# Patient Record
Sex: Male | Born: 1940 | Race: White | Hispanic: No | Marital: Single | Smoking: Former smoker
Health system: Southern US, Community
[De-identification: ages and names within clinical notes are randomized; demographics above are authoritative.]

## PROBLEM LIST (undated history)

## (undated) DIAGNOSIS — J449 Chronic obstructive pulmonary disease, unspecified: Secondary | ICD-10-CM

## (undated) DIAGNOSIS — Z972 Presence of dental prosthetic device (complete) (partial): Secondary | ICD-10-CM

## (undated) DIAGNOSIS — J45909 Unspecified asthma, uncomplicated: Secondary | ICD-10-CM

## (undated) DIAGNOSIS — R0982 Postnasal drip: Secondary | ICD-10-CM

## (undated) DIAGNOSIS — K219 Gastro-esophageal reflux disease without esophagitis: Secondary | ICD-10-CM

## (undated) DIAGNOSIS — R2230 Localized swelling, mass and lump, unspecified upper limb: Secondary | ICD-10-CM

## (undated) DIAGNOSIS — D649 Anemia, unspecified: Secondary | ICD-10-CM

## (undated) DIAGNOSIS — M1990 Unspecified osteoarthritis, unspecified site: Secondary | ICD-10-CM

## (undated) DIAGNOSIS — Z9981 Dependence on supplemental oxygen: Secondary | ICD-10-CM

## (undated) HISTORY — DX: Anemia, unspecified: D64.9

## (undated) HISTORY — PX: TONSILLECTOMY: SUR1361

## (undated) HISTORY — PX: PILONIDAL CYST EXCISION: SHX744

## (undated) HISTORY — PX: GANGLION CYST EXCISION: SHX1691

---

## 2006-06-04 HISTORY — PX: SHOULDER ARTHROSCOPY: SHX128

## 2008-04-15 ENCOUNTER — Encounter: Admission: RE | Admit: 2008-04-15 | Discharge: 2008-04-15 | Payer: Self-pay | Admitting: Orthopedic Surgery

## 2008-05-17 ENCOUNTER — Encounter: Admission: RE | Admit: 2008-05-17 | Discharge: 2008-06-02 | Payer: Self-pay | Admitting: Orthopedic Surgery

## 2008-06-08 ENCOUNTER — Encounter: Admission: RE | Admit: 2008-06-08 | Discharge: 2008-09-06 | Payer: Self-pay | Admitting: Orthopedic Surgery

## 2010-06-25 ENCOUNTER — Encounter: Payer: Self-pay | Admitting: Orthopedic Surgery

## 2012-08-12 ENCOUNTER — Other Ambulatory Visit: Payer: Self-pay | Admitting: Orthopedic Surgery

## 2012-08-12 DIAGNOSIS — M545 Low back pain, unspecified: Secondary | ICD-10-CM

## 2012-08-12 DIAGNOSIS — M7918 Myalgia, other site: Secondary | ICD-10-CM

## 2012-08-12 DIAGNOSIS — M25552 Pain in left hip: Secondary | ICD-10-CM

## 2012-08-18 ENCOUNTER — Other Ambulatory Visit: Payer: Self-pay

## 2012-08-22 ENCOUNTER — Ambulatory Visit
Admission: RE | Admit: 2012-08-22 | Discharge: 2012-08-22 | Disposition: A | Discharge status: Home / Self Care | Payer: Medicare Other | Source: Ambulatory Visit | Attending: Orthopedic Surgery | Admitting: Orthopedic Surgery

## 2012-08-22 DIAGNOSIS — M25552 Pain in left hip: Secondary | ICD-10-CM

## 2012-08-22 DIAGNOSIS — M7918 Myalgia, other site: Secondary | ICD-10-CM

## 2012-08-22 DIAGNOSIS — M545 Low back pain: Secondary | ICD-10-CM

## 2012-09-23 ENCOUNTER — Other Ambulatory Visit: Payer: Self-pay | Admitting: Neurosurgery

## 2012-09-29 ENCOUNTER — Encounter (HOSPITAL_COMMUNITY): Payer: Self-pay

## 2012-09-29 ENCOUNTER — Encounter (HOSPITAL_COMMUNITY)
Admission: RE | Admit: 2012-09-29 | Discharge: 2012-09-29 | Disposition: A | Discharge status: Home / Self Care | Payer: Medicare Other | Source: Ambulatory Visit | Attending: Neurosurgery | Admitting: Neurosurgery

## 2012-09-29 HISTORY — DX: Unspecified asthma, uncomplicated: J45.909

## 2012-09-29 HISTORY — DX: Postnasal drip: R09.82

## 2012-09-29 HISTORY — DX: Unspecified osteoarthritis, unspecified site: M19.90

## 2012-09-29 HISTORY — DX: Gastro-esophageal reflux disease without esophagitis: K21.9

## 2012-09-29 LAB — BASIC METABOLIC PANEL
BUN: 16 mg/dL (ref 6–23)
CO2: 29 mEq/L (ref 19–32)
GFR calc non Af Amer: 60 mL/min — ABNORMAL LOW (ref >90)
Glucose, Bld: 94 mg/dL (ref 70–99)
Potassium: 4.4 mEq/L (ref 3.5–5.1)
Sodium: 140 mEq/L (ref 135–145)

## 2012-09-29 LAB — CBC
Hemoglobin: 13.6 g/dL (ref 13.0–17.0)
MCH: 31.4 pg (ref 26.0–34.0)
MCHC: 34.1 g/dL (ref 30.0–36.0)
MCV: 92.1 fL (ref 78.0–100.0)
RBC: 4.33 MIL/uL (ref 4.22–5.81)

## 2012-09-29 NOTE — Progress Notes (Signed)
Pt. Reports that he hasn't seen PCP MD in 30 yrs.

## 2012-09-29 NOTE — Pre-Procedure Instructions (Addendum)
Tyler Horne  09/29/2012   Your procedure is scheduled on:  10/02/2012  Report to [facility] at 10:00 AM.  Call this number if you have problems the morning of surgery: [phone]   Remember:  Discontinue aspirin, anti-inflammatory meds.    Do not eat food or drink liquids after midnight.  WEDNESDAY   Take these medicines the morning of surgery with A SIP OF WATER: NONE   Do not wear jewelry  Do not wear lotions, powders, or perfumes. You may wear deodorant.             Men may shave face and neck.  Do not bring valuables to the hospital.  Contacts, dentures or bridgework may not be worn into surgery.  Leave suitcase in the car. After surgery it may be brought to your room.  For patients admitted to the hospital, checkout time is 11:00 AM the day of  discharge.   Patients discharged the day of surgery will not be allowed to drive  home.  Name and phone number of your driver: /w family  Special Instructions: Shower using CHG 2 nights before surgery and the night before surgery.  If you shower the day of surgery use CHG.  Use special wash - you have one bottle of CHG for all showers.  You should use approximately 1/3 of the bottle for each shower.   Please read over the following fact sheets that you were given: Pain Booklet, Coughing and Deep Breathing, MRSA Information and Surgical Site Infection Prevention

## 2012-10-01 MED ORDER — CEFAZOLIN SODIUM-DEXTROSE 2-3 GM-% IV SOLR
2.0000 g | INTRAVENOUS | Status: AC
  Administered 2012-10-02: 2 g via INTRAVENOUS
  Filled 2012-10-01: qty 50

## 2012-10-02 ENCOUNTER — Inpatient Hospital Stay (HOSPITAL_COMMUNITY)
Admission: RE | Admit: 2012-10-02 | Discharge: 2012-10-03 | DRG: 491 | Disposition: A | Discharge status: Home / Self Care | Payer: Medicare Other | Source: Ambulatory Visit | Attending: Neurosurgery | Admitting: Neurosurgery

## 2012-10-02 ENCOUNTER — Encounter (HOSPITAL_COMMUNITY)
Admission: RE | Disposition: A | Discharge status: Home / Self Care | Payer: Self-pay | Source: Ambulatory Visit | Attending: Neurosurgery

## 2012-10-02 ENCOUNTER — Encounter (HOSPITAL_COMMUNITY): Payer: Self-pay | Admitting: Certified Registered"

## 2012-10-02 ENCOUNTER — Encounter (HOSPITAL_COMMUNITY): Payer: Self-pay | Admitting: *Deleted

## 2012-10-02 ENCOUNTER — Inpatient Hospital Stay (HOSPITAL_COMMUNITY): Payer: Medicare Other

## 2012-10-02 ENCOUNTER — Inpatient Hospital Stay (HOSPITAL_COMMUNITY): Payer: Medicare Other | Admitting: Certified Registered"

## 2012-10-02 DIAGNOSIS — M51379 Other intervertebral disc degeneration, lumbosacral region without mention of lumbar back pain or lower extremity pain: Secondary | ICD-10-CM | POA: Diagnosis present

## 2012-10-02 DIAGNOSIS — J45909 Unspecified asthma, uncomplicated: Secondary | ICD-10-CM | POA: Diagnosis present

## 2012-10-02 DIAGNOSIS — Z7982 Long term (current) use of aspirin: Secondary | ICD-10-CM

## 2012-10-02 DIAGNOSIS — M5126 Other intervertebral disc displacement, lumbar region: Secondary | ICD-10-CM

## 2012-10-02 DIAGNOSIS — K219 Gastro-esophageal reflux disease without esophagitis: Secondary | ICD-10-CM | POA: Diagnosis present

## 2012-10-02 DIAGNOSIS — M5137 Other intervertebral disc degeneration, lumbosacral region: Secondary | ICD-10-CM | POA: Diagnosis present

## 2012-10-02 DIAGNOSIS — Z01812 Encounter for preprocedural laboratory examination: Secondary | ICD-10-CM

## 2012-10-02 DIAGNOSIS — Z79899 Other long term (current) drug therapy: Secondary | ICD-10-CM

## 2012-10-02 DIAGNOSIS — F17200 Nicotine dependence, unspecified, uncomplicated: Secondary | ICD-10-CM | POA: Diagnosis present

## 2012-10-02 HISTORY — PX: LUMBAR LAMINECTOMY/DECOMPRESSION MICRODISCECTOMY: SHX5026

## 2012-10-02 SURGERY — LUMBAR LAMINECTOMY/DECOMPRESSION MICRODISCECTOMY 2 LEVELS
Anesthesia: General | Laterality: Left | Wound class: Clean

## 2012-10-02 MED ORDER — ACETAMINOPHEN 325 MG PO TABS: 650.0000 mg | ORAL_TABLET | ORAL | Status: DC | PRN

## 2012-10-02 MED ORDER — ARTIFICIAL TEARS OP OINT
TOPICAL_OINTMENT | OPHTHALMIC | Status: DC | PRN
  Administered 2012-10-02: 1 via OPHTHALMIC

## 2012-10-02 MED ORDER — MEPERIDINE HCL 25 MG/ML IJ SOLN: 6.2500 mg | INTRAMUSCULAR | Status: DC | PRN

## 2012-10-02 MED ORDER — OXYCODONE HCL 5 MG/5ML PO SOLN: 5.0000 mg | Freq: Once | ORAL | Status: DC | PRN

## 2012-10-02 MED ORDER — BUPIVACAINE-EPINEPHRINE PF 0.5-1:200000 % IJ SOLN
INTRAMUSCULAR | Status: DC | PRN
  Administered 2012-10-02: 10 mL
  Administered 2012-10-02: 9 mL

## 2012-10-02 MED ORDER — PHENYLEPHRINE HCL 10 MG/ML IJ SOLN
10.0000 mg | INTRAVENOUS | Status: DC | PRN
  Administered 2012-10-02: 20 ug/min via INTRAVENOUS

## 2012-10-02 MED ORDER — SODIUM CHLORIDE 0.9 % IR SOLN
Status: DC | PRN
  Administered 2012-10-02: 13:00:00

## 2012-10-02 MED ORDER — OXYCODONE-ACETAMINOPHEN 5-325 MG PO TABS: 1.0000 | ORAL_TABLET | ORAL | Status: DC | PRN

## 2012-10-02 MED ORDER — PROPOFOL 10 MG/ML IV BOLUS
INTRAVENOUS | Status: DC | PRN
  Administered 2012-10-02: 100 mg via INTRAVENOUS

## 2012-10-02 MED ORDER — THROMBIN 20000 UNITS EX SOLR
CUTANEOUS | Status: DC | PRN
  Administered 2012-10-02: 13:00:00 via TOPICAL

## 2012-10-02 MED ORDER — PHENYLEPHRINE HCL 10 MG/ML IJ SOLN
INTRAMUSCULAR | Status: DC | PRN
  Administered 2012-10-02: 80 ug via INTRAVENOUS
  Administered 2012-10-02: 120 ug via INTRAVENOUS
  Administered 2012-10-02 (×4): 80 ug via INTRAVENOUS
  Administered 2012-10-02: 120 ug via INTRAVENOUS
  Administered 2012-10-02 (×2): 80 ug via INTRAVENOUS

## 2012-10-02 MED ORDER — GLYCOPYRROLATE 0.2 MG/ML IJ SOLN
INTRAMUSCULAR | Status: DC | PRN
  Administered 2012-10-02: 0.6 mg via INTRAVENOUS
  Administered 2012-10-02: 0.2 mg via INTRAVENOUS

## 2012-10-02 MED ORDER — OXYCODONE HCL 5 MG PO TABS: 5.0000 mg | ORAL_TABLET | Freq: Once | ORAL | Status: DC | PRN

## 2012-10-02 MED ORDER — LACTATED RINGERS IV SOLN
INTRAVENOUS | Status: DC
  Administered 2012-10-02: 18:00:00 via INTRAVENOUS

## 2012-10-02 MED ORDER — ROCURONIUM BROMIDE 100 MG/10ML IV SOLN
INTRAVENOUS | Status: DC | PRN
  Administered 2012-10-02: 50 mg via INTRAVENOUS

## 2012-10-02 MED ORDER — ACETAMINOPHEN 10 MG/ML IV SOLN
INTRAVENOUS | Status: AC
  Administered 2012-10-02: 1000 mg via INTRAVENOUS
  Filled 2012-10-02: qty 100

## 2012-10-02 MED ORDER — ALUM & MAG HYDROXIDE-SIMETH 200-200-20 MG/5ML PO SUSP: 30.0000 mL | Freq: Four times a day (QID) | ORAL | Status: DC | PRN

## 2012-10-02 MED ORDER — 0.9 % SODIUM CHLORIDE (POUR BTL) OPTIME
TOPICAL | Status: DC | PRN
  Administered 2012-10-02: 1000 mL

## 2012-10-02 MED ORDER — PHENOL 1.4 % MT LIQD: 1.0000 | OROMUCOSAL | Status: DC | PRN

## 2012-10-02 MED ORDER — ACETAMINOPHEN 650 MG RE SUPP: 650.0000 mg | RECTAL | Status: DC | PRN

## 2012-10-02 MED ORDER — CEFAZOLIN SODIUM-DEXTROSE 2-3 GM-% IV SOLR
2.0000 g | Freq: Three times a day (TID) | INTRAVENOUS | Status: AC
  Administered 2012-10-02 – 2012-10-03 (×2): 2 g via INTRAVENOUS
  Filled 2012-10-02 (×2): qty 50

## 2012-10-02 MED ORDER — LIDOCAINE HCL 4 % MT SOLN
OROMUCOSAL | Status: DC | PRN
  Administered 2012-10-02: 4 mL via TOPICAL

## 2012-10-02 MED ORDER — FENTANYL CITRATE 0.05 MG/ML IJ SOLN
INTRAMUSCULAR | Status: DC | PRN
  Administered 2012-10-02: 100 ug via INTRAVENOUS

## 2012-10-02 MED ORDER — HYDROCODONE-ACETAMINOPHEN 5-325 MG PO TABS: 1.0000 | ORAL_TABLET | ORAL | Status: DC | PRN

## 2012-10-02 MED ORDER — DIAZEPAM 5 MG PO TABS
5.0000 mg | ORAL_TABLET | Freq: Four times a day (QID) | ORAL | Status: DC | PRN
  Administered 2012-10-03: 5 mg via ORAL
  Filled 2012-10-02: qty 1

## 2012-10-02 MED ORDER — HYDROMORPHONE HCL PF 1 MG/ML IJ SOLN: 0.2500 mg | INTRAMUSCULAR | Status: DC | PRN

## 2012-10-02 MED ORDER — EPHEDRINE SULFATE 50 MG/ML IJ SOLN
INTRAMUSCULAR | Status: DC | PRN
  Administered 2012-10-02: 10 mg via INTRAVENOUS

## 2012-10-02 MED ORDER — PROMETHAZINE HCL 25 MG/ML IJ SOLN: 6.2500 mg | INTRAMUSCULAR | Status: DC | PRN

## 2012-10-02 MED ORDER — ONDANSETRON HCL 4 MG/2ML IJ SOLN: 4.0000 mg | INTRAMUSCULAR | Status: DC | PRN

## 2012-10-02 MED ORDER — MENTHOL 3 MG MT LOZG: 1.0000 | LOZENGE | OROMUCOSAL | Status: DC | PRN

## 2012-10-02 MED ORDER — LACTATED RINGERS IV SOLN
INTRAVENOUS | Status: DC | PRN
  Administered 2012-10-02 (×2): via INTRAVENOUS

## 2012-10-02 MED ORDER — ONDANSETRON HCL 4 MG/2ML IJ SOLN
INTRAMUSCULAR | Status: DC | PRN
  Administered 2012-10-02: 4 mg via INTRAVENOUS

## 2012-10-02 MED ORDER — DEXAMETHASONE SODIUM PHOSPHATE 4 MG/ML IJ SOLN
INTRAMUSCULAR | Status: DC | PRN
  Administered 2012-10-02: 4 mg via INTRAVENOUS

## 2012-10-02 MED ORDER — BACITRACIN 50000 UNITS IM SOLR
INTRAMUSCULAR | Status: AC
  Filled 2012-10-02: qty 1

## 2012-10-02 MED ORDER — DOCUSATE SODIUM 100 MG PO CAPS
100.0000 mg | ORAL_CAPSULE | Freq: Two times a day (BID) | ORAL | Status: DC
  Administered 2012-10-02 – 2012-10-03 (×2): 100 mg via ORAL
  Filled 2012-10-02 (×2): qty 1

## 2012-10-02 MED ORDER — BACITRACIN ZINC 500 UNIT/GM EX OINT
TOPICAL_OINTMENT | CUTANEOUS | Status: DC | PRN
  Administered 2012-10-02: 1 via TOPICAL

## 2012-10-02 MED ORDER — DIPHENHYDRAMINE HCL 25 MG PO CAPS: 25.0000 mg | ORAL_CAPSULE | Freq: Four times a day (QID) | ORAL | Status: DC | PRN

## 2012-10-02 MED ORDER — LIDOCAINE HCL (CARDIAC) 20 MG/ML IV SOLN
INTRAVENOUS | Status: DC | PRN
  Administered 2012-10-02: 40 mg via INTRAVENOUS

## 2012-10-02 MED ORDER — NEOSTIGMINE METHYLSULFATE 1 MG/ML IJ SOLN
INTRAMUSCULAR | Status: DC | PRN
  Administered 2012-10-02: 4 mg via INTRAVENOUS

## 2012-10-02 MED ORDER — MIDAZOLAM HCL 5 MG/5ML IJ SOLN
INTRAMUSCULAR | Status: DC | PRN
  Administered 2012-10-02: 2 mg via INTRAVENOUS

## 2012-10-02 MED ORDER — SODIUM CHLORIDE 0.9 % IV SOLN
INTRAVENOUS | Status: AC
  Filled 2012-10-02: qty 500

## 2012-10-02 MED ORDER — MIDAZOLAM HCL 2 MG/2ML IJ SOLN: 0.5000 mg | Freq: Once | INTRAMUSCULAR | Status: DC | PRN

## 2012-10-02 MED ORDER — MORPHINE SULFATE 2 MG/ML IJ SOLN: 1.0000 mg | INTRAMUSCULAR | Status: DC | PRN

## 2012-10-02 SURGICAL SUPPLY — 51 items
BAG DECANTER FOR FLEXI CONT (MISCELLANEOUS) ×2 IMPLANT
BENZOIN TINCTURE PRP APPL 2/3 (GAUZE/BANDAGES/DRESSINGS) ×2 IMPLANT
BLADE SURG ROTATE 9660 (MISCELLANEOUS) ×2 IMPLANT
BRUSH SCRUB EZ PLAIN DRY (MISCELLANEOUS) ×2 IMPLANT
BUR ACORN 6.0 (BURR) ×2 IMPLANT
BUR MATCHSTICK NEURO 3.0 LAGG (BURR) ×2 IMPLANT
CANISTER SUCTION 2500CC (MISCELLANEOUS) ×2 IMPLANT
CLOTH BEACON ORANGE TIMEOUT ST (SAFETY) ×2 IMPLANT
CONT SPEC 4OZ CLIKSEAL STRL BL (MISCELLANEOUS) ×2 IMPLANT
DRAPE LAPAROTOMY 100X72X124 (DRAPES) ×2 IMPLANT
DRAPE MICROSCOPE LEICA (MISCELLANEOUS) ×2 IMPLANT
DRAPE POUCH INSTRU U-SHP 10X18 (DRAPES) ×2 IMPLANT
DRAPE SURG 17X23 STRL (DRAPES) ×8 IMPLANT
ELECT BLADE 4.0 EZ CLEAN MEGAD (MISCELLANEOUS) ×2
ELECT REM PT RETURN 9FT ADLT (ELECTROSURGICAL) ×2
ELECTRODE BLDE 4.0 EZ CLN MEGD (MISCELLANEOUS) ×1 IMPLANT
ELECTRODE REM PT RTRN 9FT ADLT (ELECTROSURGICAL) ×1 IMPLANT
GAUZE SPONGE 4X4 16PLY XRAY LF (GAUZE/BANDAGES/DRESSINGS) IMPLANT
GLOVE BIO SURGEON STRL SZ8.5 (GLOVE) ×2 IMPLANT
GLOVE EXAM NITRILE LRG STRL (GLOVE) IMPLANT
GLOVE EXAM NITRILE MD LF STRL (GLOVE) IMPLANT
GLOVE EXAM NITRILE XL STR (GLOVE) IMPLANT
GLOVE EXAM NITRILE XS STR PU (GLOVE) IMPLANT
GLOVE INDICATOR 7.0 STRL GRN (GLOVE) ×4 IMPLANT
GLOVE SS BIOGEL STRL SZ 6.5 (GLOVE) ×2 IMPLANT
GLOVE SS BIOGEL STRL SZ 8 (GLOVE) ×1 IMPLANT
GLOVE SUPERSENSE BIOGEL SZ 6.5 (GLOVE) ×2
GLOVE SUPERSENSE BIOGEL SZ 8 (GLOVE) ×1
GOWN BRE IMP SLV AUR LG STRL (GOWN DISPOSABLE) ×2 IMPLANT
GOWN BRE IMP SLV AUR XL STRL (GOWN DISPOSABLE) ×2 IMPLANT
GOWN STRL REIN 2XL LVL4 (GOWN DISPOSABLE) IMPLANT
KIT BASIN OR (CUSTOM PROCEDURE TRAY) ×2 IMPLANT
KIT ROOM TURNOVER OR (KITS) ×2 IMPLANT
NEEDLE HYPO 21X1.5 SAFETY (NEEDLE) IMPLANT
NEEDLE HYPO 22GX1.5 SAFETY (NEEDLE) ×2 IMPLANT
NS IRRIG 1000ML POUR BTL (IV SOLUTION) ×2 IMPLANT
PACK LAMINECTOMY NEURO (CUSTOM PROCEDURE TRAY) ×2 IMPLANT
PAD ARMBOARD 7.5X6 YLW CONV (MISCELLANEOUS) ×6 IMPLANT
PATTIES SURGICAL .5 X1 (DISPOSABLE) IMPLANT
RUBBERBAND STERILE (MISCELLANEOUS) ×4 IMPLANT
SPONGE GAUZE 4X4 12PLY (GAUZE/BANDAGES/DRESSINGS) ×2 IMPLANT
SPONGE SURGIFOAM ABS GEL SZ50 (HEMOSTASIS) ×2 IMPLANT
STRIP CLOSURE SKIN 1/2X4 (GAUZE/BANDAGES/DRESSINGS) ×2 IMPLANT
SUT VIC AB 1 CT1 18XBRD ANBCTR (SUTURE) ×2 IMPLANT
SUT VIC AB 1 CT1 8-18 (SUTURE) ×2
SUT VIC AB 2-0 CP2 18 (SUTURE) ×4 IMPLANT
SYR 20CC LL (SYRINGE) IMPLANT
SYR 20ML ECCENTRIC (SYRINGE) ×2 IMPLANT
TOWEL OR 17X24 6PK STRL BLUE (TOWEL DISPOSABLE) ×2 IMPLANT
TOWEL OR 17X26 10 PK STRL BLUE (TOWEL DISPOSABLE) ×2 IMPLANT
WATER STERILE IRR 1000ML POUR (IV SOLUTION) ×2 IMPLANT

## 2012-10-02 NOTE — Progress Notes (Signed)
Subjective:  The patient is alert and pleasant. He looks well. He is in no apparent distress.  Objective: Vital signs in last 24 hours: Temp:  [97.9 F (36.6 C)-98 F (36.7 C)] 97.9 F (36.6 C) (05/01 1415) Pulse Rate:  [62-79] 62 (05/01 1445) Resp:  [15-18] 15 (05/01 1445) BP: (103-117)/(53-75) 103/69 mmHg (05/01 1445) SpO2:  [97 %-100 %] 100 % (05/01 1445)  Intake/Output from previous day:   Intake/Output this shift: Total I/O In: 1400 [I.V.:1400] Out: 50 [Blood:50]  Physical exam the patient is alert and pleasant. He is moving his lower extremities well.  Lab Results:  Recent Labs  09/29/12 1502  WBC 6.3  HGB 13.6  HCT 39.9  PLT 281   BMET  Recent Labs  09/29/12 1502  NA 140  K 4.4  CL 105  CO2 29  GLUCOSE 94  BUN 16  CREATININE 1.17  CALCIUM 9.5    Studies/Results: Dg Lumbar Spine 1 View  10/02/2012  *RADIOLOGY REPORT*  Clinical Data: Localization image for spinal surgery.  LUMBAR SPINE - 1 VIEW  Comparison: 08/22/2012.  Findings: The tip of a metallic instrument projects over the posterior elements at the level of L5-S1. 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  IMPRESSION: Tip of instrument projects over posterior elements at level of L5- S1.   Original Report Authenticated By: Onalee Hua Call     Assessment/Plan: The patient is doing well.  LOS: 0 days     Tyler Horne D 10/02/2012, 2:49 PM

## 2012-10-02 NOTE — Op Note (Signed)
Brief history: The patient is a 72 year old white male who complains of back and left leg pain consistent with a lumbar radiculopathy. He has failed medical management and was worked up with a lumbar MRI. This demonstrated a herniated disc at L4-5 and L5-S1 on the left compressing both the L5 and S1 nerve root. I discussed the various treatment options with the patient including a left L4-5 and 5 one discectomy. The patient has weighed the risks, benefits, and alternatives surgery and decided proceed with the operation.  Preoperative diagnosis: Left L4-5 and L5-S1 herniated disc, lumbago, lumbar discopathy, spinal stenosis  Postoperative diagnosis: The same  Procedure: Left L4-5 and L5-S1 Intervertebral discectomy using micro-dissection  Surgeon: Dr. Delma Officer  Asst.: Dr. Colon Branch  Anesthesia: Gen. endotracheal  Estimated blood loss: 50 cc  Drains: None  Complications: None  Description of procedure: The patient was brought to the operating room by the anesthesia team. General endotracheal anesthesia was induced. The patient was turned to the prone position on the Wilson frame. The patient's lumbosacral region was then prepared with Betadine scrub and Betadine solution. Sterile drapes were applied.  I then injected the area to be incised with Marcaine with epinephrine solution. I then used a scalpel to make a linear midline incision over the L4-5 and L5-S1 intervertebral disc space. I then used electrocautery to perform a left sided subperiosteal dissection exposing the spinous process and lamina of L4, L5 and the upper sacrum. We obtained intraoperative radiograph to confirm our location. I then inserted the [facility] retractor for exposure.  We then brought the operative microscope into the field. Under its magnification and illumination we completed the microdissection. I used a high-speed drill to perform a laminotomy at L4 and L5 on the left. I then used a Kerrison punches to  complete the left L5 hemilaminectomy and to widen the laminotomy and removed the ligamentum flavum at L4-5 and L5-S1 on the left.. We then used microdissection to free up the thecal sac and the L5 and S1 nerve root from the epidural tissue. I then used a Kerrison punch to perform a foraminotomy at about the left L5 and S1 nerve root. We then using the nerve root retractor to gently retract the thecal sac and the S1 nerve root medially. This exposed the intervertebral disc which compresses both the left L5 and S1 nerve roots. We identified the ruptured disc and remove it with the pituitary forceps. We inspected the intervertebral disc at L4-5 and L5-S1. There was a hole in the annulus at L5-S1 the left. There did not appear to be any impending herniations. I therefore did not enter into the intervertebral disc space.  I then palpated along the ventral surface of the thecal sac and along exit route of the left L5 and S1 nerve root and noted that the neural structures were well decompressed. This completed the decompression.  We then obtained hemostasis using bipolar electrocautery. We irrigated the wound out with bacitracin solution. We then removed the retractor. We then reapproximated the patient's thoracolumbar fascia with interrupted #1 Vicryl suture. We then reapproximated the patient's subcutaneous tissue with interrupted 3-0 Vicryl suture. We then reapproximated patient's skin with Steri-Strips and benzoin. The was then coated with bacitracin ointment. The drapes were removed. The patient was subsequently returned to the supine position where they were extubated by the anesthesia team. The patient was then transported to the postanesthesia care unit in stable condition. All sponge instrument and needle counts were reportedly correct at the end  of this case.

## 2012-10-02 NOTE — Transfer of Care (Signed)
Immediate Anesthesia Transfer of Care Note  Patient: Tyler Horne  Procedure(s) Performed: Procedure(s) with comments: LUMBAR LAMINECTOMY/DECOMPRESSION MICRODISCECTOMY 2 LEVELS (Left) - Left Lumbar four-five Lumbar five sacral one diskectomy  Patient Location: PACU  Anesthesia Type:General  Level of Consciousness: awake and patient cooperative  Airway & Oxygen Therapy: Patient Spontanous Breathing and Patient connected to nasal cannula oxygen  Post-op Assessment: Report given to PACU RN and Post -op Vital signs reviewed and stable  Post vital signs: Reviewed and stable  Complications: No apparent anesthesia complications

## 2012-10-02 NOTE — H&P (Signed)
Subjective: The patient is a 72 year old white male who complains of back left buttock and leg pain consistent with a lumbar radiculopathy. He has failed medical management and was worked up with a lumbar MRI. This demonstrated a herniated disc at L4-5 and L5-S1. I discussed the various treatment options with the patient including surgery. The patient has weighed the risks, benefits, and alternatives surgery and decided proceed with a left L4-5 and L5-S1 discectomy.   Past Medical History  Diagnosis Date  . Asthma     hayfever, seasonal allergies, + smoker, uses primatene pill on occas.   [name] Postnasal drip     uses Benadryl & primatene as needed   . GERD (gastroesophageal reflux disease)     uses Rolaids on rare occas.   . Arthritis     DDD, shoulder    Past Surgical History  Procedure Laterality Date  . Shoulder arthroscopy  2008    Left  . Ganglion cyst excision      L hand   . Tonsillectomy      No Known Allergies  History  Substance Use Topics  . Smoking status: Current Every Day Smoker -- 0.50 packs/day  . Smokeless tobacco: Not on file  . Alcohol Use: No    History reviewed. No pertinent family history. Prior to Admission medications   Medication Sig Start Date End Date Taking? Authorizing Provider  aspirin 325 MG tablet Take 325 mg by mouth daily as needed for pain.   Yes Historical Provider, MD  diphenhydrAMINE (BENADRYL) 25 mg capsule Take 25 mg by mouth every 6 (six) hours as needed for allergies.   Yes Historical Provider, MD  Ensure Plus (ENSURE PLUS) LIQD Take 237 mLs by mouth daily as needed (for vitamin consumption).   Yes Historical Provider, MD  Ephedrine-Guaifenesin (PRIMATENE ASTHMA) 12.5-200 MG TABS Take 0.25 tablets by mouth daily as needed (for allergies).   Yes Historical Provider, MD  ibuprofen (ADVIL,MOTRIN) 200 MG tablet Take 400 mg by mouth every 6 (six) hours as needed for pain.   Yes Historical Provider, MD  Multiple Vitamins-Minerals (CENTRUM SILVER  ADULT 50+) TABS Take 1 tablet by mouth daily.   Yes Historical Provider, MD  sodium chloride (OCEAN) 0.65 % nasal spray Place 1 spray into the nose as needed for congestion.   Yes Historical Provider, MD     Review of Systems  Positive ROS: As above  All other systems have been reviewed and were otherwise negative with the exception of those mentioned in the HPI and as above.  Objective: Vital signs in last 24 hours: Temp:  [98 F (36.7 C)] 98 F (36.7 C) (05/01 1014) Pulse Rate:  [79] 79 (05/01 1014) Resp:  [18] 18 (05/01 1014) BP: (117)/(75) 117/75 mmHg (05/01 1014) SpO2:  [97 %] 97 % (05/01 1014)  General Appearance: Alert, cooperative, no distress, appears stated age Head: Normocephalic, without obvious abnormality, atraumatic Eyes: PERRL, conjunctiva/corneas clear, EOM's intact, fundi benign, both eyes      Ears: Normal TM's and external ear canals, both ears Throat: Lips, mucosa, and tongue normal; teeth and gums normal Neck: Supple, symmetrical, trachea midline, no adenopathy; thyroid: No enlargement/tenderness/nodules; no carotid bruit or JVD Back: Symmetric, no curvature, ROM normal, no CVA tenderness Lungs: Clear to auscultation bilaterally, respirations unlabored Heart: Regular rate and rhythm, S1 and S2 normal, no murmur, rub or gallop Abdomen: Soft, non-tender, bowel sounds active all four quadrants, no masses, no organomegaly Extremities: Extremities normal, atraumatic, no cyanosis or edema Pulses: 2+  and symmetric all extremities Skin: Skin color, texture, turgor normal, no rashes or lesions  NEUROLOGIC:   Mental status: alert and oriented, no aphasia, good attention span, Fund of knowledge/ memory ok Motor Exam - grossly normal Sensory Exam - grossly normal Reflexes:  Coordination - grossly normal Gait - grossly normal Balance - grossly normal Cranial Nerves: I: smell Not tested  II: visual acuity  OS: Normal    OD: Normal   II: visual fields Full to  confrontation  II: pupils Equal, round, reactive to light  III,VII: ptosis None  III,IV,VI: extraocular muscles  Full ROM  V: mastication Normal  V: facial light touch sensation  Normal  V,VII: corneal reflex  Present  VII: facial muscle function - upper  Normal  VII: facial muscle function - lower Normal  VIII: hearing Not tested  IX: soft palate elevation  Normal  IX,X: gag reflex Present  XI: trapezius strength  5/5  XI: sternocleidomastoid strength 5/5  XI: neck flexion strength  5/5  XII: tongue strength  Normal    Data Review Lab Results  Component Value Date   WBC 6.3 09/29/2012   HGB 13.6 09/29/2012   HCT 39.9 09/29/2012   MCV 92.1 09/29/2012   PLT 281 09/29/2012   Lab Results  Component Value Date   NA 140 09/29/2012   K 4.4 09/29/2012   CL 105 09/29/2012   CO2 29 09/29/2012   BUN 16 09/29/2012   CREATININE 1.17 09/29/2012   GLUCOSE 94 09/29/2012   No results found for this basename: INR, PROTIME    Assessment/Plan: 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 I reviewed his MR scan with them and pointed out the abnormalities. We have discussed the various treatment options including surgery. I described the surgical option of left L4-5 and L5-S1 discectomy. I have described the surgery to him. I have shown him surgical models. We have discussed the risks, benefits, alternatives, and likelihood of achieving our goals with surgery. I've answered all the patient's questions. He wants to proceed with surgery.   Cristi Loron 10/02/2012 12:31 PM

## 2012-10-02 NOTE — Anesthesia Postprocedure Evaluation (Signed)
  Anesthesia Post-op Note  Patient: Tyler Horne  Procedure(s) Performed: Procedure(s) with comments: LUMBAR LAMINECTOMY/DECOMPRESSION MICRODISCECTOMY 2 LEVELS (Left) - Left Lumbar four-five Lumbar five sacral one diskectomy  Patient Location: PACU  Anesthesia Type:General  Level of Consciousness: awake, alert , oriented and patient cooperative  Airway and Oxygen Therapy: Patient Spontanous Breathing and Patient connected to nasal cannula oxygen  Post-op Pain: none  Post-op Assessment: Post-op Vital signs reviewed, Patient's Cardiovascular Status Stable, Respiratory Function Stable, Patent Airway, No signs of Nausea or vomiting and Pain level controlled  Post-op Vital Signs: Reviewed and stable  Complications: No apparent anesthesia complications

## 2012-10-02 NOTE — Preoperative (Signed)
Beta Blockers   Reason not to administer Beta Blockers:Not Applicable 

## 2012-10-02 NOTE — Anesthesia Procedure Notes (Signed)
Procedure Name: Intubation Date/Time: 10/02/2012 12:48 PM Performed by: Leona Singleton A Pre-anesthesia Checklist: Patient identified Patient Re-evaluated:Patient Re-evaluated prior to inductionOxygen Delivery Method: Circle system utilized Preoxygenation: Pre-oxygenation with 100% oxygen Intubation Type: IV induction Ventilation: Mask ventilation without difficulty Laryngoscope Size: Miller and 2 Grade View: Grade I Tube type: Oral Tube size: 7.0 mm Number of attempts: 1 Airway Equipment and Method: Stylet and LTA kit utilized Placement Confirmation: ETT inserted through vocal cords under direct vision,  positive ETCO2 and breath sounds checked- equal and bilateral Secured at: 22 cm Tube secured with: Tape Dental Injury: Teeth and Oropharynx as per pre-operative assessment

## 2012-10-02 NOTE — Anesthesia Preprocedure Evaluation (Addendum)
Anesthesia Evaluation  Patient identified by MRN, date of birth, ID band Patient awake    Reviewed: Allergy & Precautions, H&P , NPO status , Patient's Chart, lab work & pertinent test results  History of Anesthesia Complications Negative for: history of anesthetic complications  Airway Mallampati: II TM Distance: >3 FB Neck ROM: Full    Dental  (+) Edentulous Upper, Partial Lower, Dental Advisory Given, Missing and Poor Dentition   Pulmonary asthma , Current Smoker,  breath sounds clear to auscultation  Pulmonary exam normal       Cardiovascular negative cardio ROS  Rhythm:Regular Rate:Normal     Neuro/Psych Chronic back pain: ibuprofen  Neuromuscular disease (pain and numbness LLE>RLE)    GI/Hepatic Neg liver ROS, GERD-  Medicated,  Endo/Other  negative endocrine ROS  Renal/GU Renal disease (Cr 1.17)Creat 1.17  negative genitourinary   Musculoskeletal  (+) Arthritis -, Osteoarthritis,    Abdominal   Peds  Hematology negative hematology ROS (+)   Anesthesia Other Findings   Reproductive/Obstetrics                       Anesthesia Physical Anesthesia Plan  ASA: II  Anesthesia Plan: General   Post-op Pain Management:    Induction: Intravenous  Airway Management Planned: Oral ETT  Additional Equipment:   Intra-op Plan:   Post-operative Plan:   Informed Consent: I have reviewed the patients History and Physical, chart, labs and discussed the procedure including the risks, benefits and alternatives for the proposed anesthesia with the patient or authorized representative who has indicated his/her understanding and acceptance.   Dental advisory given  Plan Discussed with: Surgeon and CRNA  Anesthesia Plan Comments: (Plan routine monitors, GETA)        Anesthesia Quick Evaluation

## 2012-10-03 ENCOUNTER — Encounter (HOSPITAL_COMMUNITY): Payer: Self-pay | Admitting: *Deleted

## 2012-10-03 MED ORDER — DSS 100 MG PO CAPS: 100.0000 mg | ORAL_CAPSULE | Freq: Two times a day (BID) | ORAL | Status: AC

## 2012-10-03 MED ORDER — OXYCODONE-ACETAMINOPHEN 5-325 MG PO TABS: 1.0000 | ORAL_TABLET | ORAL | Status: DC | PRN

## 2012-10-03 MED ORDER — DIAZEPAM 5 MG PO TABS: 5.0000 mg | ORAL_TABLET | Freq: Four times a day (QID) | ORAL | Status: DC | PRN

## 2012-10-03 NOTE — Discharge Summary (Signed)
Physician Discharge Summary  Patient ID: Tyler Horne MRN: [id number] DOB/AGE: 1940-06-15 72 y.o.  Admit date: 10/02/2012 Discharge date: 10/03/2012  Admission Diagnoses: L4-5 and L5-S1 disc degeneration, herniated disc, stenosis, lumbar radiculopathy, lumbago  Discharge Diagnoses: The same Active Problems:   * No active hospital problems. *   Discharged Condition: good  Hospital Course: I admitted the patient to [facility] 10/02/2012. On that day I performed a left L4-5 and L5-S1 discectomy. The surgery went well.  The patient's postoperative course was unremarkable. On postoperative day #1 the patient looked and felt well. He requested discharge to home. He was given oral and written discharge instructions. All his questions were answered.  Consults: None Significant Diagnostic Studies: None Treatments: Left L4-5 and L5-S1 discectomy using microdissection Discharge Exam: Blood pressure 88/55, pulse 76, temperature 97.7 F (36.5 C), temperature source Oral, resp. rate 20, SpO2 98.00%. The patient is alert and oriented. His strength is normal. His dressing is clean and dry. He looks well.  Disposition: Home  Discharge Orders   Future Orders Complete By Expires     Call MD for:  difficulty breathing, headache or visual disturbances  As directed     Call MD for:  extreme fatigue  As directed     Call MD for:  hives  As directed     Call MD for:  persistant dizziness or light-headedness  As directed     Call MD for:  persistant nausea and vomiting  As directed     Call MD for:  redness, tenderness, or signs of infection (pain, swelling, redness, odor or green/yellow discharge around incision site)  As directed     Call MD for:  severe uncontrolled pain  As directed     Call MD for:  temperature >100.4  As directed     Diet - low sodium heart healthy  As directed     Discharge instructions  As directed     Comments:      Call [phone] for a followup appointment. Take a  stool softener while you are using pain medications.    Driving Restrictions  As directed     Comments:      Do not drive for 2 weeks.    Increase activity slowly  As directed     Lifting restrictions  As directed     Comments:      Do not lift more than 5 pounds. No excessive bending or twisting.    May shower / Bathe  As directed     Comments:      He may shower after the pain she is removed 3 days after surgery. Leave the incision alone.    Remove dressing in 48 hours  As directed     Comments:      Your stitches are under the scan and will dissolve by themselves. The Steri-Strips will fall off after you take a few showers. Do not rub back or pick at the wound, Leave the wound alone.        Medication List    TAKE these medications       aspirin 325 MG tablet  Take 325 mg by mouth daily as needed for pain.     CENTRUM SILVER ADULT 50+ Tabs  Take 1 tablet by mouth daily.     diazepam 5 MG tablet  Commonly known as:  VALIUM  Take 1 tablet (5 mg total) by mouth every 6 (six) hours as needed.  diphenhydrAMINE 25 mg capsule  Commonly known as:  BENADRYL  Take 25 mg by mouth every 6 (six) hours as needed for allergies.     DSS 100 MG Caps  Take 100 mg by mouth 2 (two) times daily.     Ensure Plus Liqd  Take 237 mLs by mouth daily as needed (for vitamin consumption).     ibuprofen 200 MG tablet  Commonly known as:  ADVIL,MOTRIN  Take 400 mg by mouth every 6 (six) hours as needed for pain.     oxyCODONE-acetaminophen 5-325 MG per tablet  Commonly known as:  PERCOCET/ROXICET  Take 1-2 tablets by mouth every 4 (four) hours as needed.     PRIMATENE ASTHMA 12.5-200 MG Tabs  Generic drug:  Ephedrine-Guaifenesin  Take 0.25 tablets by mouth daily as needed (for allergies).     sodium chloride 0.65 % nasal spray  Commonly known as:  OCEAN  Place 1 spray into the nose as needed for congestion.         SignedCristi Loron 10/03/2012, 7:44 AM

## 2012-10-06 ENCOUNTER — Encounter (HOSPITAL_COMMUNITY): Payer: Self-pay | Admitting: Neurosurgery

## 2014-03-24 ENCOUNTER — Ambulatory Visit
Admission: RE | Admit: 2014-03-24 | Discharge: 2014-03-24 | Disposition: A | Discharge status: Home / Self Care | Payer: Medicare PPO | Source: Ambulatory Visit | Attending: Internal Medicine | Admitting: Internal Medicine

## 2014-03-24 ENCOUNTER — Other Ambulatory Visit: Payer: Self-pay | Admitting: Internal Medicine

## 2014-03-24 DIAGNOSIS — R634 Abnormal weight loss: Secondary | ICD-10-CM

## 2016-05-21 ENCOUNTER — Other Ambulatory Visit: Payer: Self-pay | Admitting: Internal Medicine

## 2016-05-21 DIAGNOSIS — R1084 Generalized abdominal pain: Secondary | ICD-10-CM

## 2016-05-29 ENCOUNTER — Ambulatory Visit
Admission: RE | Admit: 2016-05-29 | Discharge: 2016-05-29 | Disposition: A | Discharge status: Home / Self Care | Payer: Medicare PPO | Source: Ambulatory Visit | Attending: Internal Medicine | Admitting: Internal Medicine

## 2016-05-29 DIAGNOSIS — R1084 Generalized abdominal pain: Secondary | ICD-10-CM

## 2016-07-02 ENCOUNTER — Encounter: Payer: Self-pay | Admitting: Emergency Medicine

## 2016-07-02 ENCOUNTER — Ambulatory Visit (INDEPENDENT_AMBULATORY_CARE_PROVIDER_SITE_OTHER): Payer: Medicare PPO | Admitting: Emergency Medicine

## 2016-07-02 VITALS — BP 108/68 | HR 79 | Ht 70.0 in | Wt 127.4 lb

## 2016-07-02 DIAGNOSIS — F17210 Nicotine dependence, cigarettes, uncomplicated: Secondary | ICD-10-CM | POA: Diagnosis not present

## 2016-07-02 DIAGNOSIS — I10 Essential (primary) hypertension: Secondary | ICD-10-CM | POA: Diagnosis not present

## 2016-07-02 DIAGNOSIS — J309 Allergic rhinitis, unspecified: Secondary | ICD-10-CM | POA: Insufficient documentation

## 2016-07-02 DIAGNOSIS — J301 Allergic rhinitis due to pollen: Secondary | ICD-10-CM | POA: Diagnosis not present

## 2016-07-02 DIAGNOSIS — F17200 Nicotine dependence, unspecified, uncomplicated: Secondary | ICD-10-CM | POA: Insufficient documentation

## 2016-07-02 DIAGNOSIS — J449 Chronic obstructive pulmonary disease, unspecified: Secondary | ICD-10-CM | POA: Diagnosis not present

## 2016-07-02 MED ORDER — TIOTROPIUM BROMIDE-OLODATEROL 2.5-2.5 MCG/ACT IN AERS: 2.0000 | INHALATION_SPRAY | Freq: Every day | RESPIRATORY_TRACT | 0 refills | Status: DC
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
## 2016-07-02 NOTE — Progress Notes (Signed)
Subjective:    Patient ID: Tyler Horne, male    DOB: 06/01/1941, 76 y.o.   MRN: [id number]  HPI 76 year old gentleman with a history of tobacco abuse (25 pack years), GERD, OA, chronic rhinitis. He was dx with COPD in 07/2015 based on symptoms, no hx PFT. He is referred by Dr Mancel Bale for further eval of his COPD. He reports that he has significant exertional SOB, is limited to about 100 ft on a hill. He had always been quite active until the last couple years. If he paces himself, goes slowly then he can do more. He has a lot of congestion from sinuses, sometimes to chest. Has to clear mucous most mornings. Light tan mucous. He has used Primatene tabs. He has not been on any other BD's, was given something by Dr Mancel Bale but never started. He has used mucinex-DM. Never has had a flare to his knowledge.    Review of Systems  Constitutional: Negative.  Negative for fever and unexpected weight change.  HENT: Positive for postnasal drip and rhinorrhea. Negative for congestion, dental problem, ear pain, nosebleeds, sinus pressure, sneezing, sore throat and trouble swallowing.   Eyes: Negative.  Negative for redness and itching.  Respiratory: Positive for cough and shortness of breath. Negative for chest tightness and wheezing.   Cardiovascular: Negative.  Negative for palpitations and leg swelling.  Gastrointestinal: Negative.  Negative for nausea and vomiting.  Genitourinary: Negative.  Negative for dysuria.  Musculoskeletal: Negative.  Negative for joint swelling.  Skin: Negative.  Negative for rash.  Neurological: Negative.  Negative for headaches.  Hematological: Negative.  Does not bruise/bleed easily.  Psychiatric/Behavioral: Negative.  Negative for dysphoric mood. The patient is not nervous/anxious.     Past Medical History:  Diagnosis Date  . Arthritis    DDD, shoulder  . Asthma    hayfever, seasonal allergies, + smoker, uses primatene pill on occas.   [name] GERD (gastroesophageal reflux  disease)    uses Rolaids on rare occas.   [name] Postnasal drip    uses Benadryl & primatene as needed      No family history on file.   Social History   Social History  . Marital status: Single    Spouse name: N/A  . Number of children: N/A  . Years of education: N/A   Occupational History  . Not on file.   Social History Main Topics  . Smoking status: Current Every Day Smoker    Packs/day: 0.50  . Smokeless tobacco: Not on file  . Alcohol use No  . Drug use: No  . Sexual activity: Not on file   Other Topics Concern  . Not on file   Social History Narrative  . No narrative on file  He has worked in [employer], no known asbestos.  Brief military service in the reserves.  [place] native, has lived in [place],   No Known Allergies   Outpatient Medications Prior to Visit  Medication Sig Dispense Refill  . diphenhydrAMINE (BENADRYL) 25 mg capsule Take 25 mg by mouth every 6 (six) hours as needed for allergies.    [name] docusate sodium 100 MG CAPS Take 100 mg by mouth 2 (two) times daily. 60 capsule 1  . Ensure Plus (ENSURE PLUS) LIQD Take 237 mLs by mouth daily as needed (for vitamin consumption).    [name] Ephedrine-Guaifenesin (PRIMATENE ASTHMA) 12.5-200 MG TABS Take 0.25 tablets by mouth daily as needed (for allergies).    . Multiple Vitamins-Minerals (CENTRUM SILVER ADULT  50+) TABS Take 1 tablet by mouth daily.    . sodium chloride (OCEAN) 0.65 % nasal spray Place 1 spray into the nose as needed for congestion.    [name] aspirin 325 MG tablet Take 325 mg by mouth daily as needed for pain.    . diazepam (VALIUM) 5 MG tablet Take 1 tablet (5 mg total) by mouth every 6 (six) hours as needed. (Patient not taking: Reported on 07/02/2016) 50 tablet 1  . ibuprofen (ADVIL,MOTRIN) 200 MG tablet Take 400 mg by mouth every 6 (six) hours as needed for pain.    [name] oxyCODONE-acetaminophen (PERCOCET/ROXICET) 5-325 MG per tablet Take 1-2 tablets by mouth every 4 (four) hours as needed. (Patient not  taking: Reported on 07/02/2016) 100 tablet 0   No facility-administered medications prior to visit.         Objective:   Physical Exam Vitals:   07/02/16 1555  BP: 108/68  Pulse: 79  SpO2: 95%  Weight: 127 lb 6.4 oz (57.8 kg)  Height: 5\' 10"  (1.778 m)   Gen: Pleasant, thin with some temporal wasting, in no distress,  normal affect  ENT: No lesions,  mouth clear,  oropharynx clear, no postnasal drip  Neck: No JVD, no stridor  Lungs: No use of accessory muscles, distant and decreased throughout, clear without rales or rhonchi, no wheeze  Cardiovascular: RRR, heart sounds normal, no murmur or gallops, no peripheral edema  Musculoskeletal: No deformities, no cyanosis or clubbing  Neuro: alert, non focal  Skin: Warm, no lesions or rashes     Assessment & Plan:  Tobacco use disorder Tobacco use with her COPD. Discussed the importance of cessation today. Including the impact it will likely have on his COPD, functional capacity, risk for exacerbations. Discussed with him in general the strategies we can use to try and stop. He was not ready to talk about this in detail today but I would like to revisit with him at his next visit. Time spent was 5 minutes.  COPD (chronic obstructive pulmonary disease) (HCC) History and his tobacco use and suspected the almost certainly has COPD. He very well may be hypoxemic. We will perform walking oximetry today. He needs pulmonary function testing as soon as possible. I'd also like to go ahead and start him on empiric bronchodilators before his PFTs are done. We will start Stiolto, 2 puffs once a day, see if he benefits. If he desaturates then we will discuss starting supplemental O2. I think it will overwhelm him to start BD's and O2 all at the same time. Also, I am concerned that he might smoke with O2 on and create a fire hazard.   Baltazar Apo, MD, PhD 07/02/2016, 4:52 PM [facility] [phone] or if no answer  [phone]

## 2016-07-02 NOTE — Patient Instructions (Signed)
We will perform walking oximetry today Please start using Stiolto, 2 puffs once a day until next visit. Keep track of whether this medication helps your breathing Try to decrease your use of primatene tabs as you are able.  We will need to work on stopping smoking and avoiding smoke altogether (including second hand smoke). We will revisit strategies to do this at your next visit.  We will perform full pulmonary function testing at you next visit.  Follow with Dr Lamonte Sakai in 1 month with full PFT on the same day.

## 2016-07-02 NOTE — Progress Notes (Signed)
Patient seen in the office today and instructed on use of Stiolto.  Patient expressed understanding and demonstrated technique. 

## 2016-07-02 NOTE — Assessment & Plan Note (Signed)
Tobacco use with her COPD. Discussed the importance of cessation today. Including the impact it will likely have on his COPD, functional capacity, risk for exacerbations. Discussed with him in general the strategies we can use to try and stop. He was not ready to talk about this in detail today but I would like to revisit with him at his next visit. Time spent was 5 minutes.

## 2016-08-09 ENCOUNTER — Ambulatory Visit (INDEPENDENT_AMBULATORY_CARE_PROVIDER_SITE_OTHER): Payer: Medicare PPO | Admitting: Emergency Medicine

## 2016-08-09 ENCOUNTER — Encounter: Payer: Self-pay | Admitting: Emergency Medicine

## 2016-08-09 DIAGNOSIS — J449 Chronic obstructive pulmonary disease, unspecified: Secondary | ICD-10-CM | POA: Diagnosis not present

## 2016-08-09 DIAGNOSIS — F17200 Nicotine dependence, unspecified, uncomplicated: Secondary | ICD-10-CM | POA: Diagnosis not present

## 2016-08-09 LAB — PULMONARY FUNCTION TEST
DL/VA % PRED: 49 %
DL/VA: 2.21 ml/min/mmHg/L
DLCO COR: 12.24 ml/min/mmHg
DLCO cor % pred: 42 %
DLCO unc % pred: 40 %
DLCO unc: 11.72 ml/min/mmHg
FEF 25-75 PRE: 0.4 L/s
FEF 25-75 Post: 0.79 L/sec
FEF2575-%Change-Post: 96 %
FEF2575-%PRED-POST: 40 %
FEF2575-%PRED-PRE: 20 %
FEV1-%Change-Post: 47 %
FEV1-%Pred-Post: 48 %
FEV1-%Pred-Pre: 33 %
FEV1-Post: 1.33 L
FEV1-Pre: 0.9 L
FEV1FVC-%CHANGE-POST: 11 %
FEV1FVC-%Pred-Pre: 46 %
FEV6-%CHANGE-POST: 33 %
FEV6-%PRED-POST: 91 %
FEV6-%Pred-Pre: 68 %
FEV6-Post: 3.24 L
FEV6-Pre: 2.43 L
FEV6FVC-%CHANGE-POST: 0 %
FEV6FVC-%PRED-PRE: 97 %
FEV6FVC-%Pred-Post: 98 %
FVC-%Change-Post: 32 %
FVC-%PRED-POST: 93 %
FVC-%Pred-Pre: 71 %
FVC-Post: 3.55 L
FVC-Pre: 2.68 L
POST FEV1/FVC RATIO: 37 %
PRE FEV1/FVC RATIO: 34 %
Post FEV6/FVC ratio: 91 %
Pre FEV6/FVC Ratio: 91 %

## 2016-08-09 MED ORDER — ALBUTEROL SULFATE HFA 108 (90 BASE) MCG/ACT IN AERS: 2.0000 | INHALATION_SPRAY | RESPIRATORY_TRACT | 2 refills | Status: DC | PRN

## 2016-08-09 NOTE — Assessment & Plan Note (Signed)
We will continue your Stiolto 2 puffs once a day.  You would benefit from wearing oxygen at 2L/min when you are exerting yourself.  Take albuterol 2 puffs up to every 4 hours if needed for shortness of breath.  Follow with Dr Lamonte Sakai in 3 months or sooner if you have any problems.

## 2016-08-09 NOTE — Assessment & Plan Note (Signed)
Discussed importance of smoking cessation with him today.

## 2016-08-09 NOTE — Patient Instructions (Addendum)
We will continue your Stiolto 2 puffs once a day.  You would benefit from wearing oxygen at 2L/min when you are exerting yourself.  Take albuterol 2 puffs up to every 4 hours if needed for shortness of breath.  Follow with Dr Lamonte Sakai in 3 months or sooner if you have any problems. AVOID ALL SMOKING INCLUDING SECOND HAND!

## 2016-08-09 NOTE — Progress Notes (Signed)
PFT done today. 

## 2016-08-09 NOTE — Progress Notes (Signed)
Subjective:    Patient ID: Tyler Horne, male    DOB: 1940-11-22, 76 y.o.   MRN: [id number]  HPI 76 year old gentleman with a history of tobacco abuse (25 pack years), GERD, OA, chronic rhinitis. He was dx with COPD in 07/2015 based on symptoms, no hx PFT. He is referred by Dr Mancel Bale for further eval of his COPD. He reports that he has significant exertional SOB, is limited to about 100 ft on a hill. He had always been quite active until the last couple years. If he paces himself, goes slowly then he can do more. He has a lot of congestion from sinuses, sometimes to chest. Has to clear mucous most mornings. Light tan mucous. He has used Primatene tabs. He has not been on any other BD's, was given something by Dr Mancel Bale but never started. He has used mucinex-DM. Never has had a flare to his knowledge.   ROV 08/09/16 -- Tobacco use and COPD. He had pulmonary function testing done today that I have personally reviewed. These showed very severe obstruction with a positive bronchodilator response. Lung volumes were not done. The diffusion capacity was decreased. We started him on Stiolto last time >> he believes that it has helped him some, less exertional SOB. Poor historian but he denies any change in wheeze, cough. He has very low energy.  He desaturated on RA last time, although he is in some degree of denial.    Review of Systems  Constitutional: Negative.  Negative for fever and unexpected weight change.  HENT: Positive for postnasal drip and rhinorrhea. Negative for congestion, dental problem, ear pain, nosebleeds, sinus pressure, sneezing, sore throat and trouble swallowing.   Eyes: Negative.  Negative for redness and itching.  Respiratory: Positive for cough and shortness of breath. Negative for chest tightness and wheezing.   Cardiovascular: Negative.  Negative for palpitations and leg swelling.  Gastrointestinal: Negative.  Negative for nausea and vomiting.  Genitourinary: Negative.  Negative  for dysuria.  Musculoskeletal: Negative.  Negative for joint swelling.  Skin: Negative.  Negative for rash.  Neurological: Negative.  Negative for headaches.  Hematological: Negative.  Does not bruise/bleed easily.  Psychiatric/Behavioral: Negative.  Negative for dysphoric mood. The patient is not nervous/anxious.     Past Medical History:  Diagnosis Date  . Arthritis    DDD, shoulder  . Asthma    hayfever, seasonal allergies, + smoker, uses primatene pill on occas.   [name] GERD (gastroesophageal reflux disease)    uses Rolaids on rare occas.   [name] Postnasal drip    uses Benadryl & primatene as needed      No family history on file.   Social History   Social History  . Marital status: Single    Spouse name: N/A  . Number of children: N/A  . Years of education: N/A   Occupational History  . Not on file.   Social History Main Topics  . Smoking status: Current Some Day Smoker    Packs/day: 0.50    Years: 50.00    Types: Cigarettes  . Smokeless tobacco: Never Used     Comment: 4-5 cigarettes a week  . Alcohol use No  . Drug use: No  . Sexual activity: Not on file   Other Topics Concern  . Not on file   Social History Narrative  . No narrative on file  He has worked in [employer], no known asbestos.  Brief military service in the reserves.  [place] native, has  lived in [place],   No Known Allergies   Outpatient Medications Prior to Visit  Medication Sig Dispense Refill  . acetaminophen (TYLENOL) 325 MG tablet Take 650 mg by mouth 2 times daily at 12 noon and 4 pm.    . aspirin 325 MG tablet Take 325 mg by mouth daily as needed for pain.    . diphenhydrAMINE (BENADRYL) 25 mg capsule Take 25 mg by mouth every 6 (six) hours as needed for allergies.    [name] docusate sodium 100 MG CAPS Take 100 mg by mouth 2 (two) times daily. 60 capsule 1  . Ensure Plus (ENSURE PLUS) LIQD Take 237 mLs by mouth daily as needed (for vitamin consumption).    [name] Ephedrine-Guaifenesin  (PRIMATENE ASTHMA) 12.5-200 MG TABS Take 0.25 tablets by mouth daily as needed (for allergies).    [name] ibuprofen (ADVIL,MOTRIN) 200 MG tablet Take 400 mg by mouth every 6 (six) hours as needed for pain.    . Multiple Vitamins-Minerals (CENTRUM SILVER ADULT 50+) TABS Take 1 tablet by mouth daily.    . sodium chloride (OCEAN) 0.65 % nasal spray Place 1 spray into the nose as needed for congestion.    . Tiotropium Bromide-Olodaterol (STIOLTO RESPIMAT) 2.5-2.5 MCG/ACT AERS Inhale 2 puffs into the lungs daily. 2 Inhaler 0  . diazepam (VALIUM) 5 MG tablet Take 1 tablet (5 mg total) by mouth every 6 (six) hours as needed. 50 tablet 1  . oxyCODONE-acetaminophen (PERCOCET/ROXICET) 5-325 MG per tablet Take 1-2 tablets by mouth every 4 (four) hours as needed. 100 tablet 0   No facility-administered medications prior to visit.         Objective:   Physical Exam Vitals:   08/09/16 1407  BP: 116/72  Pulse: 74  SpO2: 94%  Weight: 128 lb (58.1 kg)  Height: 5' 7.5" (1.715 m)   Gen: Pleasant, thin with some temporal wasting, in no distress,  normal affect  ENT: No lesions,  mouth clear,  oropharynx clear, no postnasal drip  Neck: No JVD, no stridor  Lungs: No use of accessory muscles, distant and decreased throughout, clear without rales or rhonchi, no wheeze  Cardiovascular: RRR, heart sounds normal, no murmur or gallops, no peripheral edema  Musculoskeletal: No deformities, no cyanosis or clubbing  Neuro: alert, non focal  Skin: Warm, no lesions or rashes     Assessment & Plan:  COPD (chronic obstructive pulmonary disease) (HCC) We will continue your Stiolto 2 puffs once a day.  You would benefit from wearing oxygen at 2L/min when you are exerting yourself.  Take albuterol 2 puffs up to every 4 hours if needed for shortness of breath.  Follow with Dr Lamonte Sakai in 3 months or sooner if you have any problems.  Tobacco use disorder Discussed importance of smoking cessation with him today.    Baltazar Apo, MD, PhD 08/09/2016, 2:37 PM [facility] [phone] or if no answer [phone]

## 2016-11-28 ENCOUNTER — Ambulatory Visit (INDEPENDENT_AMBULATORY_CARE_PROVIDER_SITE_OTHER): Payer: Medicare PPO | Admitting: Emergency Medicine

## 2016-11-28 ENCOUNTER — Encounter (INDEPENDENT_AMBULATORY_CARE_PROVIDER_SITE_OTHER): Payer: Self-pay

## 2016-11-28 ENCOUNTER — Encounter: Payer: Self-pay | Admitting: Emergency Medicine

## 2016-11-28 VITALS — BP 96/60 | HR 84 | Ht 69.5 in | Wt 128.0 lb

## 2016-11-28 DIAGNOSIS — J449 Chronic obstructive pulmonary disease, unspecified: Secondary | ICD-10-CM

## 2016-11-28 MED ORDER — IPRATROPIUM-ALBUTEROL 0.5-2.5 (3) MG/3ML IN SOLN: 3.0000 mL | Freq: Four times a day (QID) | RESPIRATORY_TRACT | 5 refills | Status: DC

## 2016-11-28 NOTE — Patient Instructions (Addendum)
We discussed using oxygen with exertion today. You would likely benefit from wearing this when you walk and exert.  We will stop [employer] using DuoNeb nebulized four times a day on a schedule Take albuterol 2 puffs up to every 4 hours if needed for shortness of breath.  Follow with Dr Lamonte Sakai in 6 months or sooner if you have any problems

## 2016-11-28 NOTE — Assessment & Plan Note (Signed)
He remains in tonight our guarding oxygen use with exertion. We discussed in detail today. I do not believe he is going to use it reliably. He also was unable to continue the Stiolto due to cost. I will try to convert him to a regimen that is less expensive  We discussed using oxygen with exertion today. You would likely benefit from wearing this when you walk and exert.  We will stop Stiolto and albuterol Start using DuoNeb nebulized four times a day on a schedule Take albuterol 2 puffs up to every 4 hours if needed for shortness of breath.  Follow with Dr Lamonte Sakai in 6 months or sooner if you have any problems

## 2016-11-28 NOTE — Progress Notes (Signed)
Subjective:    Patient ID: Tyler Horne, male    DOB: 06-20-1940, 76 y.o.   MRN: [id number]  HPI 76 year old gentleman with a history of tobacco abuse (25 pack years), GERD, OA, chronic rhinitis. He was dx with COPD in 07/2015 based on symptoms, no hx PFT. He is referred by Dr Mancel Bale for further eval of his COPD. He reports that he has significant exertional SOB, is limited to about 100 ft on a hill. He had always been quite active until the last couple years. If he paces himself, goes slowly then he can do more. He has a lot of congestion from sinuses, sometimes to chest. Has to clear mucous most mornings. Light tan mucous. He has used Primatene tabs. He has not been on any other BD's, was given something by Dr Mancel Bale but never started. He has used mucinex-DM. Never has had a flare to his knowledge.   ROV 08/09/16 -- Tobacco use and COPD. He had pulmonary function testing done today that I have personally reviewed. These showed very severe obstruction with a positive bronchodilator response. Lung volumes were not done. The diffusion capacity was decreased. We started him on Stiolto last time >> he believes that it has helped him some, less exertional SOB. Poor historian but he denies any change in wheeze, cough. He has very low energy.  He desaturated on RA last time, although he is in some degree of denial.   ROV 11/28/16 -- Patient has a history of severe obstructive disease and COPD in the setting of tobacco use. Also a decreased diffusion capacity. He has had hypoxemia with exertion documented in our office. He does have  O2 at home but he is not using it. We started him on Stiolto. He did feel some benefit but it is costly so he stopped it two weeks ago, has only been using prn. Feels that his albuterol is too expensive also. Smokes less, a few cigarettes a day.    Review of Systems  Constitutional: Negative.  Negative for fever and unexpected weight change.  HENT: Positive for postnasal drip and  rhinorrhea. Negative for congestion, dental problem, ear pain, nosebleeds, sinus pressure, sneezing, sore throat and trouble swallowing.   Eyes: Negative.  Negative for redness and itching.  Respiratory: Positive for cough and shortness of breath. Negative for chest tightness and wheezing.   Cardiovascular: Negative.  Negative for palpitations and leg swelling.  Gastrointestinal: Negative.  Negative for nausea and vomiting.  Genitourinary: Negative.  Negative for dysuria.  Musculoskeletal: Negative.  Negative for joint swelling.  Skin: Negative.  Negative for rash.  Neurological: Negative.  Negative for headaches.  Hematological: Negative.  Does not bruise/bleed easily.  Psychiatric/Behavioral: Negative.  Negative for dysphoric mood. The patient is not nervous/anxious.     Past Medical History:  Diagnosis Date  . Arthritis    DDD, shoulder  . Asthma    hayfever, seasonal allergies, + smoker, uses primatene pill on occas.   [name] GERD (gastroesophageal reflux disease)    uses Rolaids on rare occas.   [name] Postnasal drip    uses Benadryl & primatene as needed      No family history on file.   Social History   Social History  . Marital status: Single    Spouse name: N/A  . Number of children: N/A  . Years of education: N/A   Occupational History  . Not on file.   Social History Main Topics  . Smoking status: Current Some Day Smoker  Packs/day: 0.50    Years: 50.00    Types: Cigarettes  . Smokeless tobacco: Never Used     Comment: 4-5 cigarettes a week  . Alcohol use No  . Drug use: No  . Sexual activity: Not on file   Other Topics Concern  . Not on file   Social History Narrative  . No narrative on file  He has worked in [employer], no known asbestos.  Brief military service in the reserves.  [place] native, has lived in [place],   No Known Allergies   Outpatient Medications Prior to Visit  Medication Sig Dispense Refill  . acetaminophen (TYLENOL) 325 MG  tablet Take 650 mg by mouth 2 times daily at 12 noon and 4 pm.    . albuterol (PROVENTIL HFA;VENTOLIN HFA) 108 (90 Base) MCG/ACT inhaler Inhale 2 puffs into the lungs every 4 (four) hours as needed for wheezing or shortness of breath. 1 Inhaler 2  . aspirin 325 MG tablet Take 325 mg by mouth daily as needed for pain.    . diphenhydrAMINE (BENADRYL) 25 mg capsule Take 25 mg by mouth every 6 (six) hours as needed for allergies.    [name] docusate sodium 100 MG CAPS Take 100 mg by mouth 2 (two) times daily. 60 capsule 1  . Ensure Plus (ENSURE PLUS) LIQD Take 237 mLs by mouth daily as needed (for vitamin consumption).    [name] Ephedrine-Guaifenesin (PRIMATENE ASTHMA) 12.5-200 MG TABS Take 0.25 tablets by mouth daily as needed (for allergies).    [name] ibuprofen (ADVIL,MOTRIN) 200 MG tablet Take 400 mg by mouth every 6 (six) hours as needed for pain.    . Multiple Vitamins-Minerals (CENTRUM SILVER ADULT 50+) TABS Take 1 tablet by mouth daily.    . sodium chloride (OCEAN) 0.65 % nasal spray Place 1 spray into the nose as needed for congestion.    . Tiotropium Bromide-Olodaterol (STIOLTO RESPIMAT) 2.5-2.5 MCG/ACT AERS Inhale 2 puffs into the lungs daily. 2 Inhaler 0   No facility-administered medications prior to visit.         Objective:   Physical Exam Vitals:   11/28/16 1506 11/28/16 1507  BP:  96/60  Pulse:  84  SpO2:  97%  Weight: 128 lb (58.1 kg)   Height: 5' 9.5" (1.765 m)    Gen: Pleasant, thin with some temporal wasting, in no distress,  normal affect  ENT: No lesions,  mouth clear,  oropharynx clear, no postnasal drip  Neck: No JVD, no stridor  Lungs: No use of accessory muscles, distant and decreased throughout, clear without rales or rhonchi, no wheeze  Cardiovascular: RRR, heart sounds normal, no murmur or gallops, no peripheral edema  Musculoskeletal: No deformities, no cyanosis or clubbing  Neuro: alert, non focal  Skin: Warm, no lesions or rashes     Assessment & Plan:  COPD  (chronic obstructive pulmonary disease) ([facility]) He remains in tonight our guarding oxygen use with exertion. We discussed in detail today. I do not believe he is going to use it reliably. He also was unable to continue the Stiolto due to cost. I will try to convert him to a regimen that is less expensive  We discussed using oxygen with exertion today. You would likely benefit from wearing this when you walk and exert.  We will stop Stiolto and albuterol Start using DuoNeb nebulized four times a day on a schedule Take albuterol 2 puffs up to every 4 hours if needed for shortness of breath.  Follow  with Dr Lamonte Sakai in 6 months or sooner if you have any problems  Tobacco use disorder Discussed cessation today. He minimizes his use. He is not interested in stopping completely.  Baltazar Apo, MD, PhD 11/28/2016, 3:33 PM [facility] [phone] or if no answer [phone]

## 2016-11-28 NOTE — Assessment & Plan Note (Signed)
Discussed cessation today. He minimizes his use. He is not interested in stopping completely.

## 2016-11-30 ENCOUNTER — Telehealth: Payer: Self-pay | Admitting: Emergency Medicine

## 2016-11-30 NOTE — Telephone Encounter (Signed)
Called and spoke with pt and he was talking about the nebulizer meds and the inhaler.  He stated that he has been trying to figure out how much the rental will be on the nebulizer machine.  He is waiting on a friend that works at [employer] to call him back to find out the price.    Pt wants to find out the price of this so he does not get in a bind.  Will call [place] on Monday to find out the cost of the nebulizer.

## 2016-12-03 NOTE — Telephone Encounter (Signed)
Called [facility], rep states she will check with Jiles Crocker regarding this inquiry and will call us back with further info.   Will await call back.

## 2016-12-04 NOTE — Telephone Encounter (Signed)
LM for [facility]

## 2016-12-04 NOTE — Telephone Encounter (Signed)
Spoke with Tyler Horne - patient is only going to have to pay $17 out of pocket and is picking up this up today and plans to pay this at pick up. Nothing further needed.

## 2017-02-21 ENCOUNTER — Other Ambulatory Visit: Payer: Self-pay | Admitting: Emergency Medicine

## 2017-03-13 ENCOUNTER — Other Ambulatory Visit: Payer: Self-pay | Admitting: Internal Medicine

## 2017-03-13 ENCOUNTER — Ambulatory Visit
Admission: RE | Admit: 2017-03-13 | Discharge: 2017-03-13 | Disposition: A | Discharge status: Home / Self Care | Payer: Medicare PPO | Source: Ambulatory Visit | Attending: Internal Medicine | Admitting: Internal Medicine

## 2017-03-13 DIAGNOSIS — R0781 Pleurodynia: Secondary | ICD-10-CM

## 2017-04-10 ENCOUNTER — Telehealth: Payer: Self-pay | Admitting: Emergency Medicine

## 2017-04-10 NOTE — Telephone Encounter (Signed)
Left message for patient to call back to see what is going on with his breathing. Per his last AVS, RB wanted him to stop using Stiolto and just use DuoNebs 4 times daily (everyday).

## 2017-04-12 NOTE — Telephone Encounter (Signed)
I have attempted to call the pt but had to lmomtcb x 2

## 2017-04-15 NOTE — Telephone Encounter (Signed)
Attempted to contact pt. Call went straight to voicemail. lmtcb x3 for pt.

## 2017-04-16 NOTE — Telephone Encounter (Signed)
We have attempted to contact the pt several times with no success or call back from the pt. Per triage protocol, message will be closed.  

## 2017-06-10 ENCOUNTER — Other Ambulatory Visit: Payer: Self-pay | Admitting: Emergency Medicine

## 2017-07-30 ENCOUNTER — Other Ambulatory Visit: Payer: Self-pay | Admitting: Emergency Medicine

## 2017-09-07 ENCOUNTER — Other Ambulatory Visit: Payer: Self-pay | Admitting: Emergency Medicine

## 2017-10-01 ENCOUNTER — Other Ambulatory Visit: Payer: Self-pay | Admitting: Emergency Medicine

## 2017-10-09 ENCOUNTER — Telehealth: Payer: Self-pay | Admitting: Emergency Medicine

## 2017-10-09 NOTE — Telephone Encounter (Signed)
Attempted to call the pt. I did not receive an answer. I have left a message for pt to return our call.  

## 2017-10-10 MED ORDER — ALBUTEROL SULFATE HFA 108 (90 BASE) MCG/ACT IN AERS: INHALATION_SPRAY | RESPIRATORY_TRACT | 2 refills | Status: DC

## 2017-10-10 MED ORDER — IPRATROPIUM-ALBUTEROL 0.5-2.5 (3) MG/3ML IN SOLN: 3.0000 mL | Freq: Four times a day (QID) | RESPIRATORY_TRACT | 2 refills | Status: DC

## 2017-10-10 NOTE — Telephone Encounter (Signed)
Spoke with pt and advised rx sent to pharmacy. Nothing further is needed.   

## 2017-10-10 NOTE — Telephone Encounter (Signed)
lmtcb for pt.  

## 2017-10-10 NOTE — Telephone Encounter (Signed)
Pt is retuning call. Cb is [phone].

## 2017-11-01 ENCOUNTER — Ambulatory Visit: Payer: Medicare PPO | Admitting: Emergency Medicine

## 2017-11-01 ENCOUNTER — Encounter: Payer: Self-pay | Admitting: Emergency Medicine

## 2017-11-01 DIAGNOSIS — J9611 Chronic respiratory failure with hypoxia: Secondary | ICD-10-CM | POA: Diagnosis not present

## 2017-11-01 DIAGNOSIS — J449 Chronic obstructive pulmonary disease, unspecified: Secondary | ICD-10-CM | POA: Diagnosis not present

## 2017-11-01 DIAGNOSIS — F17200 Nicotine dependence, unspecified, uncomplicated: Secondary | ICD-10-CM | POA: Diagnosis not present

## 2017-11-01 MED ORDER — IPRATROPIUM-ALBUTEROL 0.5-2.5 (3) MG/3ML IN SOLN: 3.0000 mL | Freq: Four times a day (QID) | RESPIRATORY_TRACT | 5 refills | Status: DC

## 2017-11-01 NOTE — Progress Notes (Signed)
Subjective:    Patient ID: Tyler Horne, male    DOB: [birth year], 77 y.o.   MRN: [id number]  HPI  ROV 11/28/16 -- Patient has a history of severe obstructive disease and COPD in the setting of tobacco use. Also a decreased diffusion capacity. He has had hypoxemia with exertion documented in our office. He does have  O2 at home but he is not using it. We started him on Stiolto. He did feel some benefit but it is costly so he stopped it two weeks ago, has only been using prn. Feels that his albuterol is too expensive also. Smokes less, a few cigarettes a day.   ROV 11/01/17 --annual follow-up visit for 77 year old smoker with GERD, chronic rhinitis, COPD and severe obstruction on his pulmonary function testing.  He also has documented hypoxemic respiratory failure. He reports that his breathing is "pretty good".  He is using DuoNeb 3-5x a day. He has albuterol HFA, uses this about 8-10x a day. Often when he needs to cough up mucous, when he is SOB. He still has stiolto but doesn't take on a schedule - too costly. He is still smoking about 1 a day. No flares in the last year. Still uses primatene prn occasionally. He is usually reliable with his O2 at home.    Review of Systems  Constitutional: Negative.  Negative for fever and unexpected weight change.  HENT: Positive for postnasal drip and rhinorrhea. Negative for congestion, dental problem, ear pain, nosebleeds, sinus pressure, sneezing, sore throat and trouble swallowing.   Eyes: Negative.  Negative for redness and itching.  Respiratory: Positive for cough and shortness of breath. Negative for chest tightness and wheezing.   Cardiovascular: Negative.  Negative for palpitations and leg swelling.  Gastrointestinal: Negative.  Negative for nausea and vomiting.  Genitourinary: Negative.  Negative for dysuria.  Musculoskeletal: Negative.  Negative for joint swelling.  Skin: Negative.  Negative for rash.  Neurological: Negative.  Negative for  headaches.  Hematological: Negative.  Does not bruise/bleed easily.  Psychiatric/Behavioral: Negative.  Negative for dysphoric mood. The patient is not nervous/anxious.     Past Medical History:  Diagnosis Date  . Arthritis    DDD, shoulder  . Asthma    hayfever, seasonal allergies, + smoker, uses primatene pill on occas.   [name] GERD (gastroesophageal reflux disease)    uses Rolaids on rare occas.   [name] Postnasal drip    uses Benadryl & primatene as needed      No family history on file.   Social History   Socioeconomic History  . Marital status: Single    Spouse name: Not on file  . Number of children: Not on file  . Years of education: Not on file  . Highest education level: Not on file  Occupational History  . Not on file  Social Needs  . Financial resource strain: Not on file  . Food insecurity:    Worry: Not on file    Inability: Not on file  . Transportation needs:    Medical: Not on file    Non-medical: Not on file  Tobacco Use  . Smoking status: Current Some Day Smoker    Packs/day: 0.50    Years: 50.00    Pack years: 25.00    Types: Cigarettes  . Smokeless tobacco: Never Used  . Tobacco comment: 4-5 cigarettes a week  Substance and Sexual Activity  . Alcohol use: No  . Drug use: No  . Sexual activity: Not on file  Lifestyle  . Physical activity:    Days per week: Not on file    Minutes per session: Not on file  . Stress: Not on file  Relationships  . Social connections:    Talks on phone: Not on file    Gets together: Not on file    Attends religious service: Not on file    Active member of club or organization: Not on file    Attends meetings of clubs or organizations: Not on file    Relationship status: Not on file  . Intimate partner violence:    Fear of current or ex partner: Not on file    Emotionally abused: Not on file    Physically abused: Not on file    Forced sexual activity: Not on file  Other Topics Concern  . Not on file  Social  History Narrative  . Not on file  He has worked in [employer], no known asbestos.  Brief military service in the reserves.  [place] native, has lived in [place],   No Known Allergies   Outpatient Medications Prior to Visit  Medication Sig Dispense Refill  . acetaminophen (TYLENOL) 325 MG tablet Take 650 mg by mouth 2 times daily at 12 noon and 4 pm.    . albuterol (VENTOLIN HFA) 108 (90 Base) MCG/ACT inhaler INHALE 2 PUFFS INTO THE LUNGS EVERY 4 HOURS AS NEEDED FOR WHEEZE OR SHORTNESS OF BREATH. 18 Inhaler 2  . aspirin 325 MG tablet Take 325 mg by mouth daily as needed for pain.    . diphenhydrAMINE (BENADRYL) 25 mg capsule Take 25 mg by mouth every 6 (six) hours as needed for allergies.    [name] docusate sodium 100 MG CAPS Take 100 mg by mouth 2 (two) times daily. 60 capsule 1  . Ensure Plus (ENSURE PLUS) LIQD Take 237 mLs by mouth daily as needed (for vitamin consumption).    [name] Ephedrine-Guaifenesin (PRIMATENE ASTHMA) 12.5-200 MG TABS Take 0.25 tablets by mouth daily as needed (for allergies).    [name] ibuprofen (ADVIL,MOTRIN) 200 MG tablet Take 400 mg by mouth every 6 (six) hours as needed for pain.    [name] ipratropium-albuterol (DUONEB) 0.5-2.5 (3) MG/3ML SOLN Take 3 mLs by nebulization 4 (four) times daily. 120 mL 2  . Multiple Vitamins-Minerals (CENTRUM SILVER ADULT 50+) TABS Take 1 tablet by mouth daily.    . sodium chloride (OCEAN) 0.65 % nasal spray Place 1 spray into the nose as needed for congestion.    . Tiotropium Bromide-Olodaterol (STIOLTO RESPIMAT) 2.5-2.5 MCG/ACT AERS Inhale 2 puffs into the lungs daily. 2 Inhaler 0   No facility-administered medications prior to visit.         Objective:   Physical Exam Vitals:   11/01/17 1110  BP: 112/70  Pulse: 79  SpO2: 92%  Weight: 132 lb 6.4 oz (60.1 kg)  Height: 5' 7.5" (1.715 m)   Gen: Pleasant, thin with some temporal wasting, in no distress,  normal affect  ENT: No lesions,  mouth clear,  oropharynx clear, no postnasal  drip  Neck: No JVD, no stridor  Lungs: No use of accessory muscles, distant and decreased throughout, clear without rales or rhonchi, no wheeze  Cardiovascular: RRR, heart sounds normal, no murmur or gallops, no peripheral edema  Musculoskeletal: No deformities, no cyanosis or clubbing  Neuro: alert, non focal  Skin: Warm, no lesions or rashes     Assessment & Plan:  Tobacco use disorder Discussed and recommended cessation. He is not  planning to completely stop   COPD (chronic obstructive pulmonary disease) (HCC) Uses Duoneb, albuterol, stiolto all prn.  Plan to stop Stiolto, change him to maintenance DuoNeb 4 times a day.  Explained the rationale to him of a maintenance bronchodilator versus using albuterol as needed.  I also recommended that he use the albuterol no more frequently than 4 hours.  Chronic respiratory failure with hypoxia ([place]) He wears his oxygen reliably at home but does not take it out.  He believes that his tanks are too cumbersome.  I will try to titrate him to pulse oxygen and if he can tolerate then change him to a portable oxygen concentrator to use when he is out.  His company is [employer].  Baltazar Apo, MD, PhD 11/01/2017, 11:58 AM [facility] [phone] or if no answer [phone]

## 2017-11-01 NOTE — Assessment & Plan Note (Signed)
Uses Duoneb, albuterol, stiolto all prn.  Plan to stop Stiolto, change him to maintenance DuoNeb 4 times a day.  Explained the rationale to him of a maintenance bronchodilator versus using albuterol as needed.  I also recommended that he use the albuterol no more frequently than 4 hours.

## 2017-11-01 NOTE — Patient Instructions (Signed)
We will stop Stiolto We will continue DuoNeb 4 times a day on a schedule. Use albuterol 2 puffs up to every 4 hours (no more frequently) if needed for shortness of breath, cough, wheezing, chest tightness. We will assess to see if you can get a portable oxygen concentrator.  If so we will order this for you through [employer]. You need to stop smoking completely. Follow with Dr Lamonte Sakai in 12 months or sooner if you have any problems

## 2017-11-01 NOTE — Assessment & Plan Note (Signed)
He wears his oxygen reliably at home but does not take it out.  He believes that his tanks are too cumbersome.  I will try to titrate him to pulse oxygen and if he can tolerate then change him to a portable oxygen concentrator to use when he is out.  His company is [employer].

## 2017-11-01 NOTE — Assessment & Plan Note (Signed)
Discussed and recommended cessation. He is not planning to completely stop

## 2017-11-14 ENCOUNTER — Telehealth: Payer: Self-pay | Admitting: Emergency Medicine

## 2017-11-14 DIAGNOSIS — J449 Chronic obstructive pulmonary disease, unspecified: Secondary | ICD-10-CM

## 2017-11-14 NOTE — Telephone Encounter (Signed)
Pt is aware that message has been sent to [place] [facility] regarding POC. Advised pt that we will contact him with a response.

## 2017-11-14 NOTE — Telephone Encounter (Signed)
Yes please order POC with the settings as indicated below, 2L/min pulsed at rest, 4L/min pulsed w exertion.

## 2017-11-14 NOTE — Telephone Encounter (Signed)
New DME order placed for new POC, 2L/min at rest. 4L/min exertion per RB.  Nothing further at this time.

## 2017-11-14 NOTE — Telephone Encounter (Signed)
Called and spoke to [place] with [facility]. Corene Cornea stated that pt came in for qualifying walk for POC, during the time that our POC was broken. Pt qualified for 2L pulse at rest and 4L pulse with exertion. Corene Cornea provided with with sats from test and asked that [facility] note be updated. [facility] note has been updated based of sats provided by Corene Cornea.  RB please advise if okay to order POC.

## 2017-11-14 NOTE — Telephone Encounter (Signed)
Patient calling about POC. CB is [phone]

## 2017-12-31 ENCOUNTER — Other Ambulatory Visit: Payer: Self-pay | Admitting: Emergency Medicine

## 2018-02-17 ENCOUNTER — Other Ambulatory Visit: Payer: Self-pay | Admitting: Emergency Medicine

## 2018-02-17 NOTE — Addendum Note (Signed)
Addended by: Desmond Dike C on: 02/17/2018 03:29 PM   Modules accepted: Orders

## 2018-04-09 ENCOUNTER — Telehealth: Payer: Self-pay | Admitting: Emergency Medicine

## 2018-04-09 MED ORDER — IPRATROPIUM-ALBUTEROL 0.5-2.5 (3) MG/3ML IN SOLN: 3.0000 mL | Freq: Four times a day (QID) | RESPIRATORY_TRACT | 5 refills | Status: DC

## 2018-04-09 NOTE — Telephone Encounter (Signed)
Called and spoke with patient, advised that refill has been sent. Nothing further needed.

## 2018-07-11 ENCOUNTER — Other Ambulatory Visit: Payer: Self-pay | Admitting: Emergency Medicine

## 2018-07-13 ENCOUNTER — Other Ambulatory Visit: Payer: Self-pay | Admitting: Emergency Medicine

## 2018-09-20 ENCOUNTER — Other Ambulatory Visit: Payer: Self-pay | Admitting: Emergency Medicine

## 2018-10-14 ENCOUNTER — Other Ambulatory Visit: Payer: Self-pay | Admitting: Emergency Medicine

## 2018-11-09 ENCOUNTER — Other Ambulatory Visit: Payer: Self-pay | Admitting: Emergency Medicine

## 2018-12-04 ENCOUNTER — Other Ambulatory Visit: Payer: Self-pay

## 2018-12-04 ENCOUNTER — Ambulatory Visit: Payer: Medicare PPO | Admitting: Emergency Medicine

## 2018-12-04 ENCOUNTER — Encounter: Payer: Self-pay | Admitting: Emergency Medicine

## 2018-12-04 DIAGNOSIS — J9611 Chronic respiratory failure with hypoxia: Secondary | ICD-10-CM | POA: Diagnosis not present

## 2018-12-04 DIAGNOSIS — J449 Chronic obstructive pulmonary disease, unspecified: Secondary | ICD-10-CM

## 2018-12-04 DIAGNOSIS — F17200 Nicotine dependence, unspecified, uncomplicated: Secondary | ICD-10-CM | POA: Diagnosis not present

## 2018-12-04 NOTE — Assessment & Plan Note (Signed)
He remains confused about his nebulizer treatments, the schedule, how to do these.  I tried to stress with him to do a complete treatment, 4 times daily of DuoNeb.  He can use albuterol as needed.  I will give him the nebulized version in addition to his HFA.  He prefers Ventolin the pro-air, feels that he gets better benefit.

## 2018-12-04 NOTE — Progress Notes (Signed)
Subjective:    Patient ID: Tyler Horne, male    DOB: [birth year], 78 y.o.   MRN: [id number]  HPI  ROV 11/28/16 -- Patient has a history of severe obstructive disease and COPD in the setting of tobacco use. Also a decreased diffusion capacity. He has had hypoxemia with exertion documented in our office. He does have  O2 at home but he is not using it. We started him on Stiolto. He did feel some benefit but it is costly so he stopped it two weeks ago, has only been using prn. Feels that his albuterol is too expensive also. Smokes less, a few cigarettes a day.   ROV 11/01/17 --annual follow-up visit for 78 year old smoker with GERD, chronic rhinitis, COPD and severe obstruction on his pulmonary function testing.  He also has documented hypoxemic respiratory failure. He reports that his breathing is "pretty good".  He is using DuoNeb 3-5x a day. He has albuterol HFA, uses this about 8-10x a day. Often when he needs to cough up mucous, when he is SOB. He still has stiolto but doesn't take on a schedule - too costly. He is still smoking about 1 a day. No flares in the last year. Still uses primatene prn occasionally. He is usually reliable with his O2 at home.   ROV 12/04/2018 --78 year old man active smoker with a history of very severe COPD, GERD, chronic rhinitis.  He has documented hypoxemic respiratory failure, has been using 2L/min continuous and 2L/min pulsed, supposed to be on 3-4L/min pulsed. Was desaturated.  At his last visit we stopped Stiolto because he was not using it reliably.  He is supposed to be on DuoNeb 4 times a day - doesn't do a full treatment, sounds like he uses it all through the day.  He is using albuterol approximately 4-5x a day.  No flares reported. Coughs daily, clears gray mucous.    Review of Systems  Constitutional: Negative.  Negative for fever and unexpected weight change.  HENT: Positive for postnasal drip and rhinorrhea. Negative for congestion, dental problem, ear pain,  nosebleeds, sinus pressure, sneezing, sore throat and trouble swallowing.   Eyes: Negative.  Negative for redness and itching.  Respiratory: Positive for cough and shortness of breath. Negative for chest tightness and wheezing.   Cardiovascular: Negative.  Negative for palpitations and leg swelling.  Gastrointestinal: Negative.  Negative for nausea and vomiting.  Genitourinary: Negative.  Negative for dysuria.  Musculoskeletal: Negative.  Negative for joint swelling.  Skin: Negative.  Negative for rash.  Neurological: Negative.  Negative for headaches.  Hematological: Negative.  Does not bruise/bleed easily.  Psychiatric/Behavioral: Negative.  Negative for dysphoric mood. The patient is not nervous/anxious.        Objective:   Physical Exam Vitals:   12/04/18 1534  BP: 120/72  Pulse: 88  Temp: 98.7 F (37.1 C)  TempSrc: Oral  SpO2: 98%  Weight: 134 lb 12.8 oz (61.1 kg)  Height: 5\' 7"  (1.702 m)   Gen: Pleasant, thin with some temporal wasting, in no distress,  normal affect  ENT: No lesions,  mouth clear,  oropharynx clear, no postnasal drip  Neck: No JVD, no stridor  Lungs: No use of accessory muscles, distant and decreased throughout, few exp wheezes.   Cardiovascular: RRR, heart sounds normal, no murmur or gallops, no peripheral edema  Musculoskeletal: No deformities, no cyanosis or clubbing  Neuro: alert, non focal  Skin: Warm, no lesions or rashes     Assessment & Plan:  COPD (  chronic obstructive pulmonary disease) ([place]) He remains confused about his nebulizer treatments, the schedule, how to do these.  I tried to stress with him to do a complete treatment, 4 times daily of DuoNeb.  He can use albuterol as needed.  I will give him the nebulized version in addition to his HFA.  He prefers Ventolin the pro-air, feels that he gets better benefit.  Tobacco use disorder Still smoking.  Briefly discussed cessation but he was not interested and talk about this  Chronic  respiratory failure with hypoxia ([place]) Tried to clarify his oxygen use with him today.  He is to use 2 L/min when he is using continuous flow, 3 to 4 L/min when he switches over to his portable concentrator.  Baltazar Apo, MD, PhD 12/04/2018, 4:00 PM [facility] [phone] or if no answer [phone]

## 2018-12-04 NOTE — Assessment & Plan Note (Signed)
Tried to clarify his oxygen use with him today.  He is to use 2 L/min when he is using continuous flow, 3 to 4 L/min when he switches over to his portable concentrator.

## 2018-12-04 NOTE — Patient Instructions (Signed)
Please start using your albuterol/ipratropium (DuoNeb) nebulizer treatments 4 times a day on a schedule reliably.  Use a full treatment not a partial treatment. You can do albuterol either 2 puffs or 1 nebulizer treatment up to every 4 hours if you need medication in between.  We will order nebulized albuterol for you. Continue your oxygen at 2 L/min when you are using your continuous flow concentrator at home. Increase your oxygen to 3 to 4 L/min when you are exerting herself with your portable oxygen concentrator (this is pulsed flow). Follow with Dr Lamonte Sakai in 6 months or sooner if you have any problems

## 2018-12-04 NOTE — Assessment & Plan Note (Signed)
Still smoking.  Briefly discussed cessation but he was not interested and talk about this

## 2018-12-08 ENCOUNTER — Other Ambulatory Visit: Payer: Self-pay | Admitting: Emergency Medicine

## 2019-03-23 ENCOUNTER — Telehealth: Payer: Self-pay | Admitting: Hematology and Oncology

## 2019-03-23 NOTE — Telephone Encounter (Signed)
A new patient appt has been scheduled for Mr. Scalia to see Dr. Lorenso Courier on 10/23 at 2pm. Pt aware to arrive 15 minutes early.

## 2019-03-26 ENCOUNTER — Other Ambulatory Visit: Payer: Self-pay | Admitting: Hematology and Oncology

## 2019-03-26 DIAGNOSIS — D649 Anemia, unspecified: Secondary | ICD-10-CM

## 2019-03-26 NOTE — Progress Notes (Signed)
[place] [phone]   [phone] [place] NOTE  Patient Care Team: Lorene Dy, MD as PCP - General (Internal Medicine)  Hematological/Oncological History #Normocytic Anemia 1) Establish care with Dr. Lorenso Courier on 03/27/19 due to Hgb drop 14-12.2 per outside records  CHIEF COMPLAINTS/PURPOSE OF CONSULTATION:  " They say my RBC are low "  HISTORY OF PRESENTING ILLNESS:  Tyler Horne 78 y.o. male with medical history significant for COPD on home O2, GERD and postnasal drip who presents for evaluation of a new onset anemia. The patient was seen by his PCP on 02/10/19 at which time he was noted to have a Hgb drop from 14 to 12.2. The notes record this was a normocytic anemia. No further lab studies from his outside visit are available.   On exam today [name] notes he has a baseline level of shortness of breath. He is not able to walk to his mailbox without needing a chair at the end. He presented today in clinic in a wheelchair on O2. He notes no recent changing in his breathing issues from COPD. When told about the low blood count he has been taking OTC iron, 2 pills twice daily. He has been having issues with constipation as well, though he notes he always has "rabbit turds". He denies any chest pain, overt bleeding, though he does endorse dark stools (coinciding with iron usage). He reports he 'quit' smoking, though he found a cigarette last week in his house and he smoked it. He has not purchased a pack since April 2020.   We do not have any current records of Mr. Bromell receiving a colonoscopy. He notes he has had one, but he is unsure when. He does note that he has dentures and avoids "tough" food, including red meat. He does eat chicken and vegetables "when available".   A 10 point ROS was otherwise negative.   MEDICAL HISTORY:  Past Medical History:  Diagnosis Date  . Arthritis    DDD, shoulder  . Asthma    hayfever, seasonal  allergies, + smoker, uses primatene pill on occas.   [name] GERD (gastroesophageal reflux disease)    uses Rolaids on rare occas.   [name] Postnasal drip    uses Benadryl & primatene as needed     SURGICAL HISTORY: Past Surgical History:  Procedure Laterality Date  . GANGLION CYST EXCISION     L hand   . LUMBAR LAMINECTOMY/DECOMPRESSION MICRODISCECTOMY Left 10/02/2012   Procedure: LUMBAR LAMINECTOMY/DECOMPRESSION MICRODISCECTOMY 2 LEVELS;  Surgeon: Ophelia Charter, MD;  Location: [facility];  Service: Neurosurgery;  Laterality: Left;  Left Lumbar four-five Lumbar five sacral one diskectomy  . SHOULDER ARTHROSCOPY  2008   Left  . TONSILLECTOMY      SOCIAL HISTORY: Social History   Socioeconomic History  . Marital status: Single    Spouse name: Not on file  . Number of children: Not on file  . Years of education: Not on file  . Highest education level: Not on file  Occupational History  . Not on file  Social Needs  . Financial resource strain: Not on file  . Food insecurity    Worry: Not on file    Inability: Not on file  . Transportation needs    Medical: Not on file    Non-medical: Not on file  Tobacco Use  . Smoking status: Current Some Day Smoker    Packs/day: 0.50    Years: 50.00    Pack  years: 25.00    Types: Cigarettes  . Smokeless tobacco: Never Used  . Tobacco comment: Pt. states that he is down to 1-2 cigs a week  Substance and Sexual Activity  . Alcohol use: No  . Drug use: No  . Sexual activity: Not on file  Lifestyle  . Physical activity    Days per week: Not on file    Minutes per session: Not on file  . Stress: Not on file  Relationships  . Social Herbalist on phone: Not on file    Gets together: Not on file    Attends religious service: Not on file    Active member of club or organization: Not on file    Attends meetings of clubs or organizations: Not on file    Relationship status: Not on file  . Intimate partner violence    Fear of  current or ex partner: Not on file    Emotionally abused: Not on file    Physically abused: Not on file    Forced sexual activity: Not on file  Other Topics Concern  . Not on file  Social History Narrative  . Not on file    FAMILY HISTORY: No family history on file.  ALLERGIES:  has No Known Allergies.  MEDICATIONS:  Current Outpatient Medications  Medication Sig Dispense Refill  . ALPRAZolam (XANAX) 0.25 MG tablet Take 0.25 mg by mouth 2 (two) times daily as needed for anxiety.    . Cyanocobalamin 1000 MCG CAPS Take by mouth every morning.    . ferrous sulfate 325 (65 FE) MG tablet Take 325 mg by mouth 2 (two) times daily with a meal.    . traMADol (ULTRAM) 50 MG tablet Take by mouth every 12 (twelve) hours as needed.    [name] acetaminophen (TYLENOL) 325 MG tablet Take 650 mg by mouth 2 times daily at 12 noon and 4 pm.    . albuterol (VENTOLIN HFA) 108 (90 Base) MCG/ACT inhaler INHALE 2 PUFFS INTO THE LUNGS EVERY 4 HOURS AS NEEDED FOR WHEEZE OR SHORTNESS OF BREATH. 18 Inhaler 2  . diphenhydrAMINE (BENADRYL) 25 mg capsule Take 25 mg by mouth every 6 (six) hours as needed for allergies.    [name] docusate sodium 100 MG CAPS Take 100 mg by mouth 2 (two) times daily. 60 capsule 1  . Ensure Plus (ENSURE PLUS) LIQD Take 237 mLs by mouth daily as needed (for vitamin consumption).    [name] Ephedrine-Guaifenesin (PRIMATENE ASTHMA) 12.5-200 MG TABS Take 0.25 tablets by mouth daily as needed (for allergies).    [name] ipratropium-albuterol (DUONEB) 0.5-2.5 (3) MG/3ML SOLN USE 3 MLS BY NEBULIZATION 4 (FOUR) TIMES DAILY. 360 mL 5  . meclizine (ANTIVERT) 25 MG tablet TAKE 1/2 TAB BY MOUTH 3 TIMES A DAY FOR BALANCE    . Multiple Vitamins-Minerals (CENTRUM SILVER ADULT 50+) TABS Take 1 tablet by mouth daily.    . sodium chloride (OCEAN) 0.65 % nasal spray Place 1 spray into the nose as needed for congestion.    . tamsulosin (FLOMAX) 0.4 MG CAPS capsule Take 0.4 mg by mouth daily.    . Tiotropium Bromide-Olodaterol  (STIOLTO RESPIMAT) 2.5-2.5 MCG/ACT AERS Inhale 2 puffs into the lungs daily. (Patient not taking: Reported on 12/04/2018) 2 Inhaler 0   No current facility-administered medications for this visit.     REVIEW OF SYSTEMS:   Constitutional: ( - ) fevers, ( - )  chills , ( - ) night sweats Eyes: ( - )  blurriness of vision, ( - ) double vision, ( - ) watery eyes Ears, nose, mouth, throat, and face: ( - ) mucositis, ( - ) sore throat Respiratory: ( + ) cough, ( + ) dyspnea, ( +) wheezes Cardiovascular: ( - ) palpitation, ( - ) chest discomfort, ( - ) lower extremity swelling Gastrointestinal:  ( - ) nausea, ( - ) heartburn, ( - ) change in bowel habits, constipation (+) Skin: ( - ) abnormal skin rashes Lymphatics: ( - ) new lymphadenopathy, ( - ) easy bruising Neurological: ( - ) numbness, ( - ) tingling, ( - ) new weaknesses Behavioral/Psych: ( - ) mood change, ( - ) new changes  All other systems were reviewed with the patient and are negative.  PHYSICAL EXAMINATION: ECOG PERFORMANCE STATUS: 3 - Symptomatic, >50% confined to bed  Vitals:   03/27/19 1414  BP: 120/78  Pulse: 89  Resp: 18  Temp: 98.5 F (36.9 C)  SpO2: 96%   Filed Weights   03/27/19 1414  Weight: 136 lb 11.2 oz (62 kg)    GENERAL: chronically ill appearing elderly Caucasian male in NAD  SKIN: skin color, texture, turgor are normal, no rashes or significant lesions EYES: conjunctiva are pink and non-injected, sclera clear NECK: supple, non-tender LYMPH:  no palpable lymphadenopathy in the cervical, axillary or inguinal LUNGS:diminished breath sounds bilaterally HEART: regular rate & rhythm and no murmurs and no lower extremity edema ABDOMEN: soft, non-tender, non-distended, normal bowel sounds Musculoskeletal: no cyanosis of digits and no clubbing  PSYCH: alert & oriented x 3, fluent speech NEURO: no focal motor/sensory deficits  LABORATORY DATA:  I have reviewed the data as listed Lab Results  Component Value  Date   WBC 6.3 09/29/2012   HGB 13.6 09/29/2012   HCT 39.9 09/29/2012   MCV 92.1 09/29/2012   PLT 281 09/29/2012    BLOOD FILM:  I personally reviewed the patient's peripheral blood smear today.  There were no peripheral blasts.  The white blood cells and red blood cells were of normal morphology, with no hypersegmented neutrophils, schistocytes, dacryocytes, or spherocytes. There was no anisocytosis or poikilocytosis.  The platelets are of normal size and I have verified that there is no platelet clumping. No abnormalities noted on this blood film.   RADIOGRAPHIC STUDIES: No relevant radiological studies.   ASSESSMENT & PLAN Caz Westermeyer 78 y.o. male with medical history significant for COPD on home O2, GERD, and postnasal drip who presents for evaluation of a new onset anemia. The patient was seen by his PCP on 02/10/19 at which time he was noted to have a Hgb drop from 14 to 12.2. There are no further outside records available for review.   Based on records Mr. Kerker has an acute normocytic anemia. The most common cause of normocytic anemia is GI bleeding. If Mr. Eze has not had this done it would be prudent to assure he is up to date on his colon screenings. If anesthesia is a concern with his breathing fecal occult cards may be used as a substitute.   Further details will be available once our full anemia workup is complete, though at this time the etiology of this mild drop in Hgb is not clear.   #New Onset Normocytic Anemia --will evaluate patient with full anemia workup, to include nutritional studies (iron panel, B12, folate), TSH, and review of peripheral blood film --will assess for patient's RBC production capacity with EPO, reticulocyte count --further workup with SPEP and SFLC  to r/o additional causes --with normocytic anemia in this case GI evaluation is warranted. If no recent screening colonoscopy, recommend PCP have patient referred to colonoscopy or provide fecal occult  blood cards.  --in the interim, recommend Mr. Hajjar hold his OTC iron supplements --RTC scheduled in 4 weeks as a placeholder in the event intervention is required.   All questions were answered. The patient knows to call the clinic with any problems, questions or concerns.  A total of more than 60 minutes were spent face-to-face with the patient during this encounter and over half of that time was spent on counseling and coordination of care as outlined above.   Ledell Peoples, MD Department of Hematology/Oncology [place] [facility] Phone: [phone] Pager: [phone] Email: [email or web address]   03/27/2019 3:29 PM

## 2019-03-27 ENCOUNTER — Encounter: Payer: Self-pay | Admitting: Hematology and Oncology

## 2019-03-27 ENCOUNTER — Inpatient Hospital Stay: Payer: Medicare PPO | Attending: Hematology and Oncology

## 2019-03-27 ENCOUNTER — Other Ambulatory Visit: Payer: Self-pay

## 2019-03-27 ENCOUNTER — Inpatient Hospital Stay (HOSPITAL_BASED_OUTPATIENT_CLINIC_OR_DEPARTMENT_OTHER): Payer: Medicare PPO | Admitting: Hematology and Oncology

## 2019-03-27 VITALS — BP 120/78 | HR 89 | Temp 98.5°F | Resp 18 | Ht 67.0 in | Wt 136.7 lb

## 2019-03-27 DIAGNOSIS — F17210 Nicotine dependence, cigarettes, uncomplicated: Secondary | ICD-10-CM

## 2019-03-27 DIAGNOSIS — D649 Anemia, unspecified: Secondary | ICD-10-CM | POA: Insufficient documentation

## 2019-03-27 DIAGNOSIS — Z9981 Dependence on supplemental oxygen: Secondary | ICD-10-CM

## 2019-03-27 DIAGNOSIS — K219 Gastro-esophageal reflux disease without esophagitis: Secondary | ICD-10-CM | POA: Insufficient documentation

## 2019-03-27 DIAGNOSIS — J449 Chronic obstructive pulmonary disease, unspecified: Secondary | ICD-10-CM | POA: Diagnosis not present

## 2019-03-27 LAB — CBC WITH DIFFERENTIAL (CANCER CENTER ONLY)
Abs Immature Granulocytes: 0.01 10*3/uL (ref 0.00–0.07)
Basophils Absolute: 0.1 10*3/uL (ref 0.0–0.1)
Basophils Relative: 1 %
Eosinophils Absolute: 0.5 10*3/uL (ref 0.0–0.5)
Eosinophils Relative: 8 %
HCT: 35.6 % — ABNORMAL LOW (ref 39.0–52.0)
Hemoglobin: 11.5 g/dL — ABNORMAL LOW (ref 13.0–17.0)
Immature Granulocytes: 0 %
Lymphocytes Relative: 20 %
Lymphs Abs: 1.3 10*3/uL (ref 0.7–4.0)
MCH: 30.9 pg (ref 26.0–34.0)
MCHC: 32.3 g/dL (ref 30.0–36.0)
MCV: 95.7 fL (ref 80.0–100.0)
Monocytes Absolute: 0.5 10*3/uL (ref 0.1–1.0)
Monocytes Relative: 8 %
Neutro Abs: 4.2 10*3/uL (ref 1.7–7.7)
Neutrophils Relative %: 63 %
Platelet Count: 301 10*3/uL (ref 150–400)
RBC: 3.72 MIL/uL — ABNORMAL LOW (ref 4.22–5.81)
RDW: 12.5 % (ref 11.5–15.5)
WBC Count: 6.6 10*3/uL (ref 4.0–10.5)
nRBC: 0 % (ref 0.0–0.2)

## 2019-03-27 LAB — CMP (CANCER CENTER ONLY)
ALT: 13 U/L (ref 0–44)
AST: 24 U/L (ref 15–41)
Albumin: 4.3 g/dL (ref 3.5–5.0)
Alkaline Phosphatase: 68 U/L (ref 38–126)
Anion gap: 9 (ref 5–15)
BUN: 20 mg/dL (ref 8–23)
CO2: 30 mmol/L (ref 22–32)
Calcium: 9.8 mg/dL (ref 8.9–10.3)
Chloride: 98 mmol/L (ref 98–111)
Creatinine: 1.64 mg/dL — ABNORMAL HIGH (ref 0.61–1.24)
GFR, Est AFR Am: 46 mL/min — ABNORMAL LOW (ref >60)
GFR, Estimated: 39 mL/min — ABNORMAL LOW (ref >60)
Glucose, Bld: 96 mg/dL (ref 70–99)
Potassium: 4.3 mmol/L (ref 3.5–5.1)
Sodium: 137 mmol/L (ref 135–145)
Total Bilirubin: 0.4 mg/dL (ref 0.3–1.2)
Total Protein: 7.1 g/dL (ref 6.5–8.1)

## 2019-03-27 LAB — FOLATE: Folate: 17.1 ng/mL (ref >5.9)

## 2019-03-27 LAB — SAVE SMEAR(SSMR), FOR PROVIDER SLIDE REVIEW

## 2019-03-27 LAB — VITAMIN B12: Vitamin B-12: 1364 pg/mL — ABNORMAL HIGH (ref 180–914)

## 2019-03-27 LAB — RETICULOCYTES
Immature Retic Fract: 10.9 % (ref 2.3–15.9)
RBC.: 3.68 MIL/uL — ABNORMAL LOW (ref 4.22–5.81)
Retic Count, Absolute: 47.1 10*3/uL (ref 19.0–186.0)
Retic Ct Pct: 1.3 % (ref 0.4–3.1)

## 2019-03-27 LAB — LACTATE DEHYDROGENASE: LDH: 150 U/L (ref 98–192)

## 2019-03-28 LAB — HAPTOGLOBIN: Haptoglobin: 167 mg/dL (ref 34–355)

## 2019-03-30 ENCOUNTER — Telehealth: Payer: Self-pay | Admitting: Hematology and Oncology

## 2019-03-30 LAB — PROTEIN ELECTROPHORESIS, SERUM
A/G Ratio: 1.5 (ref 0.7–1.7)
Albumin ELP: 4.1 g/dL (ref 2.9–4.4)
Alpha-1-Globulin: 0.2 g/dL (ref 0.0–0.4)
Alpha-2-Globulin: 0.8 g/dL (ref 0.4–1.0)
Beta Globulin: 1.1 g/dL (ref 0.7–1.3)
Gamma Globulin: 0.8 g/dL (ref 0.4–1.8)
Globulin, Total: 2.8 g/dL (ref 2.2–3.9)
Total Protein ELP: 6.9 g/dL (ref 6.0–8.5)

## 2019-03-30 LAB — IRON AND TIBC
Iron: 70 ug/dL (ref 42–163)
Saturation Ratios: 16 % — ABNORMAL LOW (ref 20–55)
TIBC: 429 ug/dL — ABNORMAL HIGH (ref 202–409)
UIBC: 359 ug/dL (ref 117–376)

## 2019-03-30 LAB — FERRITIN: Ferritin: 16 ng/mL — ABNORMAL LOW (ref 24–336)

## 2019-03-30 LAB — TSH: TSH: 1.893 u[IU]/mL (ref 0.320–4.118)

## 2019-03-30 LAB — KAPPA/LAMBDA LIGHT CHAINS
Kappa free light chain: 27.6 mg/L — ABNORMAL HIGH (ref 3.3–19.4)
Kappa, lambda light chain ratio: 1.66 — ABNORMAL HIGH (ref 0.26–1.65)
Lambda free light chains: 16.6 mg/L (ref 5.7–26.3)

## 2019-03-30 NOTE — Telephone Encounter (Signed)
Scheduled appt per 10/23 los.  Spoke with pt and he is aware of the appt date and time.

## 2019-03-31 ENCOUNTER — Other Ambulatory Visit: Payer: Self-pay | Admitting: Hematology and Oncology

## 2019-03-31 DIAGNOSIS — D509 Iron deficiency anemia, unspecified: Secondary | ICD-10-CM

## 2019-03-31 MED ORDER — FERROUS SULFATE 325 (65 FE) MG PO TBEC: 325.0000 mg | DELAYED_RELEASE_TABLET | Freq: Every day | ORAL | 1 refills | Status: DC

## 2019-04-03 LAB — METHYLMALONIC ACID, SERUM: Methylmalonic Acid, Quantitative: 271 nmol/L (ref 0–378)

## 2019-04-16 ENCOUNTER — Other Ambulatory Visit: Payer: Self-pay | Admitting: *Deleted

## 2019-04-16 ENCOUNTER — Telehealth: Payer: Self-pay | Admitting: *Deleted

## 2019-04-16 NOTE — Telephone Encounter (Signed)
Patients brother Devonte Endrizzi called to question if the patient was scheduled for visit next week and to also follow up with colonoscopy.  Advised that PCP should coordinate that.

## 2019-04-20 ENCOUNTER — Telehealth: Payer: Self-pay | Admitting: *Deleted

## 2019-04-20 ENCOUNTER — Other Ambulatory Visit: Payer: Self-pay | Admitting: Hematology and Oncology

## 2019-04-20 DIAGNOSIS — D509 Iron deficiency anemia, unspecified: Secondary | ICD-10-CM

## 2019-04-20 NOTE — Telephone Encounter (Signed)
Received call from pt's brother, Ilda Mori. He wanted to confirm the patient's appt on 04/24/19 and to tell us that pt's PCP did not know who pt's GI doctor was and could we schedule pt's colonoscopy. Advised that we can make referral to [facility] and then the colonoscopy can be scheduled through their office. Dr. Lorenso Courier made aware of need for GI referral. Pt's brother voiced understanding.

## 2019-04-22 ENCOUNTER — Other Ambulatory Visit: Payer: Self-pay | Admitting: *Deleted

## 2019-04-22 DIAGNOSIS — D509 Iron deficiency anemia, unspecified: Secondary | ICD-10-CM

## 2019-04-24 ENCOUNTER — Inpatient Hospital Stay: Payer: Medicare PPO | Attending: Hematology and Oncology | Admitting: Hematology and Oncology

## 2019-04-24 ENCOUNTER — Other Ambulatory Visit: Payer: Self-pay

## 2019-04-24 ENCOUNTER — Inpatient Hospital Stay: Payer: Medicare PPO

## 2019-04-24 VITALS — BP 108/71 | HR 86 | Temp 98.3°F | Resp 18 | Ht 67.0 in | Wt 134.5 lb

## 2019-04-24 DIAGNOSIS — J449 Chronic obstructive pulmonary disease, unspecified: Secondary | ICD-10-CM | POA: Insufficient documentation

## 2019-04-24 DIAGNOSIS — Z720 Tobacco use: Secondary | ICD-10-CM | POA: Diagnosis not present

## 2019-04-24 DIAGNOSIS — D509 Iron deficiency anemia, unspecified: Secondary | ICD-10-CM

## 2019-04-24 DIAGNOSIS — Z9981 Dependence on supplemental oxygen: Secondary | ICD-10-CM | POA: Diagnosis not present

## 2019-04-24 DIAGNOSIS — D649 Anemia, unspecified: Secondary | ICD-10-CM | POA: Insufficient documentation

## 2019-04-24 DIAGNOSIS — K219 Gastro-esophageal reflux disease without esophagitis: Secondary | ICD-10-CM | POA: Insufficient documentation

## 2019-04-24 DIAGNOSIS — R0602 Shortness of breath: Secondary | ICD-10-CM | POA: Diagnosis not present

## 2019-04-24 LAB — CBC WITH DIFFERENTIAL (CANCER CENTER ONLY)
Abs Immature Granulocytes: 0.01 10*3/uL (ref 0.00–0.07)
Basophils Absolute: 0 10*3/uL (ref 0.0–0.1)
Basophils Relative: 1 %
Eosinophils Absolute: 0.4 10*3/uL (ref 0.0–0.5)
Eosinophils Relative: 7 %
HCT: 36 % — ABNORMAL LOW (ref 39.0–52.0)
Hemoglobin: 11.5 g/dL — ABNORMAL LOW (ref 13.0–17.0)
Immature Granulocytes: 0 %
Lymphocytes Relative: 24 %
Lymphs Abs: 1.4 10*3/uL (ref 0.7–4.0)
MCH: 30.7 pg (ref 26.0–34.0)
MCHC: 31.9 g/dL (ref 30.0–36.0)
MCV: 96.3 fL (ref 80.0–100.0)
Monocytes Absolute: 0.6 10*3/uL (ref 0.1–1.0)
Monocytes Relative: 10 %
Neutro Abs: 3.5 10*3/uL (ref 1.7–7.7)
Neutrophils Relative %: 58 %
Platelet Count: 298 10*3/uL (ref 150–400)
RBC: 3.74 MIL/uL — ABNORMAL LOW (ref 4.22–5.81)
RDW: 12.6 % (ref 11.5–15.5)
WBC Count: 6 10*3/uL (ref 4.0–10.5)
nRBC: 0 % (ref 0.0–0.2)

## 2019-04-24 LAB — CMP (CANCER CENTER ONLY)
ALT: 15 U/L (ref 0–44)
AST: 20 U/L (ref 15–41)
Albumin: 4.1 g/dL (ref 3.5–5.0)
Alkaline Phosphatase: 81 U/L (ref 38–126)
Anion gap: 9 (ref 5–15)
BUN: 16 mg/dL (ref 8–23)
CO2: 30 mmol/L (ref 22–32)
Calcium: 9.7 mg/dL (ref 8.9–10.3)
Chloride: 101 mmol/L (ref 98–111)
Creatinine: 1.41 mg/dL — ABNORMAL HIGH (ref 0.61–1.24)
GFR, Est AFR Am: 55 mL/min — ABNORMAL LOW (ref >60)
GFR, Estimated: 47 mL/min — ABNORMAL LOW (ref >60)
Glucose, Bld: 89 mg/dL (ref 70–99)
Potassium: 4.4 mmol/L (ref 3.5–5.1)
Sodium: 140 mmol/L (ref 135–145)
Total Bilirubin: 0.3 mg/dL (ref 0.3–1.2)
Total Protein: 6.9 g/dL (ref 6.5–8.1)

## 2019-04-24 NOTE — Progress Notes (Signed)
[place] [phone]   [phone]  PROGRESS NOTE  Patient Care Team: Lorene Dy, MD as PCP - General (Internal Medicine)  Hematological/Oncological History  #Normocytic Anemia 1) Establish care with Dr. Lorenso Courier on 03/27/19 due to Hgb drop 14-12.2 per outside records 2) 03/27/2019: Hgb 11.5, WBC 6.6, Plt 301. Iron 70, TIBC 429, Sat 16% ferritin 16 3) 04/24/2019: Hgb 11.5, WBC 6.0, Plt 298. Iron studies pending.  Interval History:  Tyler Horne 78 y.o. male with medical history significant for iron deficiency anemia presents for a follow up visit.   Since his last visit Tyler Horne notes that he has been well.  He notes that he has been taking his iron pill twice daily which is not what we had initially recommended.  He also is not sure what he takes the pill with.  In the interim unfortunately he has also has not been able to schedule a colonoscopy.  His brother is with him today and notes that he will assist him in getting this scheduled sometime in December.  Tyler Horne also notes that he is not having issues with dark stools or constipation at this time.  Tyler Horne also is at his current baseline level of shortness of breath.  He notes he has been using a rescue inhaler on a regular basis to try to improve his lung function.  He also inquired about the use of an expectorant to help alleviate his mucus buildup.  Since his last visit he has lost 2 pounds.  His brother notes that his diet comprises mostly of fast food in particular a [employer] double.  Today we reviewed the labs which showed that his hemoglobin was 11.5 which is unchanged from prior iron labs are still pending at this time.  A full 10 point ROS is listed below.  MEDICAL HISTORY:  Past Medical History:  Diagnosis Date  . Arthritis    DDD, shoulder  . Asthma    hayfever, seasonal allergies, + smoker, uses primatene pill on occas.   [name] GERD (gastroesophageal reflux disease)    uses  Rolaids on rare occas.   [name] Postnasal drip    uses Benadryl & primatene as needed     SURGICAL HISTORY: Past Surgical History:  Procedure Laterality Date  . GANGLION CYST EXCISION     L hand   . LUMBAR LAMINECTOMY/DECOMPRESSION MICRODISCECTOMY Left 10/02/2012   Procedure: LUMBAR LAMINECTOMY/DECOMPRESSION MICRODISCECTOMY 2 LEVELS;  Surgeon: Ophelia Charter, MD;  Location: [facility];  Service: Neurosurgery;  Laterality: Left;  Left Lumbar four-five Lumbar five sacral one diskectomy  . SHOULDER ARTHROSCOPY  2008   Left  . TONSILLECTOMY      SOCIAL HISTORY: Social History   Socioeconomic History  . Marital status: Single    Spouse name: Not on file  . Number of children: 0  . Years of education: Not on file  . Highest education level: Not on file  Occupational History  . Occupation: Community education officer  . Financial resource strain: Not on file  . Food insecurity    Worry: Not on file    Inability: Not on file  . Transportation needs    Medical: Not on file    Non-medical: Not on file  Tobacco Use  . Smoking status: Current Some Day Smoker    Packs/day: 0.50    Years: 50.00    Pack years: 25.00    Types: Cigarettes  . Smokeless tobacco: Never Used  . Tobacco  comment: Pt. states that he is down to 1-2 cigs a week  Substance and Sexual Activity  . Alcohol use: No  . Drug use: No  . Sexual activity: Not on file  Lifestyle  . Physical activity    Days per week: Not on file    Minutes per session: Not on file  . Stress: Not on file  Relationships  . Social Herbalist on phone: Not on file    Gets together: Not on file    Attends religious service: Not on file    Active member of club or organization: Not on file    Attends meetings of clubs or organizations: Not on file    Relationship status: Not on file  . Intimate partner violence    Fear of current or ex partner: Not on file    Emotionally abused: Not on file    Physically abused: Not on  file    Forced sexual activity: Not on file  Other Topics Concern  . Not on file  Social History Narrative  . Not on file    FAMILY HISTORY: Family History  Problem Relation Age of Onset  . Cancer Mother   . Dementia Father   . Diabetes Mellitus II Brother     ALLERGIES:  has No Known Allergies.  MEDICATIONS:  Current Outpatient Medications  Medication Sig Dispense Refill  . acetaminophen (TYLENOL) 325 MG tablet Take 650 mg by mouth 2 times daily at 12 noon and 4 pm.    . albuterol (VENTOLIN HFA) 108 (90 Base) MCG/ACT inhaler INHALE 2 PUFFS INTO THE LUNGS EVERY 4 HOURS AS NEEDED FOR WHEEZE OR SHORTNESS OF BREATH. 18 Inhaler 2  . ALPRAZolam (XANAX) 0.25 MG tablet Take 0.25 mg by mouth 2 (two) times daily as needed for anxiety.    . Cyanocobalamin 1000 MCG CAPS Take by mouth every morning.    . diphenhydrAMINE (BENADRYL) 25 mg capsule Take 25 mg by mouth every 6 (six) hours as needed for allergies.    [name] docusate sodium 100 MG CAPS Take 100 mg by mouth 2 (two) times daily. 60 capsule 1  . Ensure Plus (ENSURE PLUS) LIQD Take 237 mLs by mouth daily as needed (for vitamin consumption).    [name] Ephedrine-Guaifenesin (PRIMATENE ASTHMA) 12.5-200 MG TABS Take 0.25 tablets by mouth daily as needed (for allergies).    . ferrous sulfate 325 (65 FE) MG EC tablet Take 1 tablet (325 mg total) by mouth daily with breakfast. Take with orange juice of vitamin C to help absorption. 90 tablet 1  . ipratropium-albuterol (DUONEB) 0.5-2.5 (3) MG/3ML SOLN USE 3 MLS BY NEBULIZATION 4 (FOUR) TIMES DAILY. 360 mL 5  . meclizine (ANTIVERT) 25 MG tablet TAKE 1/2 TAB BY MOUTH 3 TIMES A DAY FOR BALANCE    . Multiple Vitamins-Minerals (CENTRUM SILVER ADULT 50+) TABS Take 1 tablet by mouth daily.    . sodium chloride (OCEAN) 0.65 % nasal spray Place 1 spray into the nose as needed for congestion.    . tamsulosin (FLOMAX) 0.4 MG CAPS capsule Take 0.4 mg by mouth daily.    . Tiotropium Bromide-Olodaterol (STIOLTO  RESPIMAT) 2.5-2.5 MCG/ACT AERS Inhale 2 puffs into the lungs daily. (Patient not taking: Reported on 04/24/2019) 2 Inhaler 0  . traMADol (ULTRAM) 50 MG tablet Take by mouth every 12 (twelve) hours as needed.     No current facility-administered medications for this visit.     REVIEW OF SYSTEMS:   Constitutional: ( - )  fevers, ( - )  chills , ( - ) night sweats Eyes: ( - ) blurriness of vision, ( - ) double vision, ( - ) watery eyes Ears, nose, mouth, throat, and face: ( - ) mucositis, ( - ) sore throat Respiratory: ( + ) cough, ( + ) dyspnea, ( + ) wheezes Cardiovascular: ( - ) palpitation, ( - ) chest discomfort, ( - ) lower extremity swelling Gastrointestinal:  ( - ) nausea, ( - ) heartburn, ( - ) change in bowel habits Skin: ( - ) abnormal skin rashes Lymphatics: ( - ) new lymphadenopathy, ( - ) easy bruising Neurological: ( - ) numbness, ( - ) tingling, ( - ) new weaknesses Behavioral/Psych: ( - ) mood change, ( - ) new changes  All other systems were reviewed with the patient and are negative.  PHYSICAL EXAMINATION: ECOG PERFORMANCE STATUS: 3 - Symptomatic, >50% confined to bed  Vitals:   04/24/19 1442  BP: 108/71  Pulse: 86  Resp: 18  Temp: 98.3 F (36.8 C)  SpO2: 94%   Filed Weights   04/24/19 1442  Weight: 134 lb 8 oz (61 kg)    GENERAL: chronically ill appearing elderly Caucasian male in no distress and comfortable. In a wheelchair SKIN: skin color, texture, turgor are normal, no rashes or significant lesions EYES: conjunctiva are pink and non-injected, sclera clear LUNGS: clear to auscultation and percussion with normal breathing effort HEART: regular rate & rhythm and no murmurs and no lower extremity edema ABDOMEN: soft, non-tender, non-distended, normal bowel sounds Musculoskeletal: no cyanosis of digits and no clubbing  PSYCH: alert & oriented x 3, fluent speech NEURO: no focal motor/sensory deficits  LABORATORY DATA:  I have reviewed the data as listed  Recent Results (from the past 2160 hour(s))  Iron and TIBC     Status: Abnormal   Collection Time: 03/27/19  3:15 PM  Result Value Ref Range   Iron 70 42 - 163 ug/dL   TIBC 429 (H) 202 - 409 ug/dL   Saturation Ratios 16 (L) 20 - 55 %   UIBC 359 117 - 376 ug/dL    Comment: Performed at [facility], [address] [place], [place] [address]  Ferritin     Status: Abnormal   Collection Time: 03/27/19  3:15 PM  Result Value Ref Range   Ferritin 16 (L) 24 - 336 ng/mL    Comment: Performed at [facility], [place] [address] [place], [place] [address]  Lactate dehydrogenase (LDH)     Status: None   Collection Time: 03/27/19  3:19 PM  Result Value Ref Range   LDH 150 98 - 192 U/L    Comment: Performed at [facility], [place] [address], [place], [place] [address]  CMP ([place] only)     Status: Abnormal   Collection Time: 03/27/19  3:19 PM  Result Value Ref Range   Sodium 137 135 - 145 mmol/L   Potassium 4.3 3.5 - 5.1 mmol/L   Chloride 98 98 - 111 mmol/L   CO2 30 22 - 32 mmol/L   Glucose, Bld 96 70 - 99 mg/dL   BUN 20 8 - 23 mg/dL   Creatinine 1.64 (H) 0.61 - 1.24 mg/dL   Calcium 9.8 8.9 - 10.3 mg/dL   Total Protein 7.1 6.5 - 8.1 g/dL   Albumin 4.3 3.5 - 5.0 g/dL   AST 24 15 - 41 U/L   ALT 13 0 -  44 U/L   Alkaline Phosphatase 68 38 - 126 U/L   Total Bilirubin 0.4 0.3 - 1.2 mg/dL   GFR, Est Non Af Am 39 (L) >60 mL/min   GFR, Est AFR Am 46 (L) >60 mL/min   Anion gap 9 5 - 15    Comment: Performed at [facility], [place] [address] [place], [place] [address]  Save Smear [facility])     Status: None   Collection Time: 03/27/19  3:19 PM  Result Value Ref Range   Smear Review SMEAR STAINED AND AVAILABLE FOR REVIEW     Comment: Performed at [facility], [address], [place], [place] [address]  CBC with Differential ([place] Only)     Status: Abnormal    Collection Time: 03/27/19  3:19 PM  Result Value Ref Range   WBC Count 6.6 4.0 - 10.5 K/uL   RBC 3.72 (L) 4.22 - 5.81 MIL/uL   Hemoglobin 11.5 (L) 13.0 - 17.0 g/dL   HCT 35.6 (L) 39.0 - 52.0 %   MCV 95.7 80.0 - 100.0 fL   MCH 30.9 26.0 - 34.0 pg   MCHC 32.3 30.0 - 36.0 g/dL   RDW 12.5 11.5 - 15.5 %   Platelet Count 301 150 - 400 K/uL   nRBC 0.0 0.0 - 0.2 %   Neutrophils Relative % 63 %   Neutro Abs 4.2 1.7 - 7.7 K/uL   Lymphocytes Relative 20 %   Lymphs Abs 1.3 0.7 - 4.0 K/uL   Monocytes Relative 8 %   Monocytes Absolute 0.5 0.1 - 1.0 K/uL   Eosinophils Relative 8 %   Eosinophils Absolute 0.5 0.0 - 0.5 K/uL   Basophils Relative 1 %   Basophils Absolute 0.1 0.0 - 0.1 K/uL   Immature Granulocytes 0 %   Abs Immature Granulocytes 0.01 0.00 - 0.07 K/uL    Comment: Performed at [facility], [place] [address] [place], [place] [address]  Haptoglobin     Status: None   Collection Time: 03/27/19  3:19 PM  Result Value Ref Range   Haptoglobin 167 34 - 355 mg/dL    Comment: (NOTE) Performed At: [facility] [place], [place] [id number] Rush Farmer MD [id number]   Vitamin B12     Status: Abnormal   Collection Time: 03/27/19  3:19 PM  Result Value Ref Range   Vitamin B-12 1,364 (H) 180 - 914 pg/mL    Comment: RESULTS CONFIRMED BY MANUAL DILUTION (NOTE) This assay is not validated for testing neonatal or myeloproliferative syndrome specimens for Vitamin B12 levels. Performed at [facility], [place] [address], [place], [place] [address]   Kappa/lambda light chains     Status: Abnormal   Collection Time: 03/27/19  3:19 PM  Result Value Ref Range   Kappa free light chain 27.6 (H) 3.3 - 19.4 mg/L   Lamda free light chains 16.6 5.7 - 26.3 mg/L   Kappa, lamda light chain ratio 1.66 (H) 0.26 - 1.65    Comment: (NOTE) Performed At: [facility] [address] [place], [place] [id number] Rush Farmer MD  [id number]   Methylmalonic acid, serum     Status: None   Collection Time: 03/27/19  3:19 PM  Result Value Ref Range   Methylmalonic Acid, Quantitative 271 0 - 378 nmol/L   Disclaimer: Comment     Comment: (NOTE) This test was developed and its performance characteristics determined by [facility]. It has not  been cleared or approved by the Food and Drug Administration. Performed At: [facility] [place], [place] [id number] Rush Farmer MD [id number]   Reticulocytes     Status: Abnormal   Collection Time: 03/27/19  3:20 PM  Result Value Ref Range   Retic Ct Pct 1.3 0.4 - 3.1 %   RBC. 3.68 (L) 4.22 - 5.81 MIL/uL   Retic Count, Absolute 47.1 19.0 - 186.0 K/uL   Immature Retic Fract 10.9 2.3 - 15.9 %    Comment: Performed at [facility], [address], [place], [place] [address]  SPEP (Serum protein electrophoresis)     Status: None   Collection Time: 03/27/19  3:20 PM  Result Value Ref Range   Total Protein ELP 6.9 6.0 - 8.5 g/dL   Albumin ELP 4.1 2.9 - 4.4 g/dL   Alpha-1-Globulin 0.2 0.0 - 0.4 g/dL   Alpha-2-Globulin 0.8 0.4 - 1.0 g/dL   Beta Globulin 1.1 0.7 - 1.3 g/dL   Gamma Globulin 0.8 0.4 - 1.8 g/dL   M-Spike, % Not Observed Not Observed g/dL   SPE Interp. Comment     Comment: (NOTE) The SPE pattern appears unremarkable. Evidence of monoclonal protein is not apparent. Performed At: [facility] [place], [place] [id number] Rush Farmer MD [id number]    Comment Comment     Comment: (NOTE) Protein electrophoresis scan will follow via computer, mail, or courier delivery.    Globulin, Total 2.8 2.2 - 3.9 g/dL   A/G Ratio 1.5 0.7 - 1.7  Folate, Serum     Status: None   Collection Time: 03/27/19  3:20 PM  Result Value Ref Range   Folate 17.1 >5.9 ng/mL    Comment: Performed at [facility], [place] [address] [place], [place] [address]  TSH     Status: None    Collection Time: 03/27/19  3:20 PM  Result Value Ref Range   TSH 1.893 0.320 - 4.118 uIU/mL    Comment: Performed at [facility], [place] [address], [place], [place] [address]  CMP ([place] only)     Status: Abnormal   Collection Time: 04/24/19  2:26 PM  Result Value Ref Range   Sodium 140 135 - 145 mmol/L   Potassium 4.4 3.5 - 5.1 mmol/L   Chloride 101 98 - 111 mmol/L   CO2 30 22 - 32 mmol/L   Glucose, Bld 89 70 - 99 mg/dL   BUN 16 8 - 23 mg/dL   Creatinine 1.41 (H) 0.61 - 1.24 mg/dL   Calcium 9.7 8.9 - 10.3 mg/dL   Total Protein 6.9 6.5 - 8.1 g/dL   Albumin 4.1 3.5 - 5.0 g/dL   AST 20 15 - 41 U/L   ALT 15 0 - 44 U/L   Alkaline Phosphatase 81 38 - 126 U/L   Total Bilirubin 0.3 0.3 - 1.2 mg/dL   GFR, Est Non Af Am 47 (L) >60 mL/min   GFR, Est AFR Am 55 (L) >60 mL/min   Anion gap 9 5 - 15    Comment: Performed at [facility], [address], [place], [place] [address]  CBC with Differential ([place] Only)     Status: Abnormal   Collection Time: 04/24/19  2:26 PM  Result Value Ref Range   WBC Count 6.0 4.0 - 10.5 K/uL   RBC 3.74 (L) 4.22 - 5.81 MIL/uL   Hemoglobin 11.5 (L) 13.0 -  17.0 g/dL   HCT 36.0 (L) 39.0 - 52.0 %   MCV 96.3 80.0 - 100.0 fL   MCH 30.7 26.0 - 34.0 pg   MCHC 31.9 30.0 - 36.0 g/dL   RDW 12.6 11.5 - 15.5 %   Platelet Count 298 150 - 400 K/uL   nRBC 0.0 0.0 - 0.2 %   Neutrophils Relative % 58 %   Neutro Abs 3.5 1.7 - 7.7 K/uL   Lymphocytes Relative 24 %   Lymphs Abs 1.4 0.7 - 4.0 K/uL   Monocytes Relative 10 %   Monocytes Absolute 0.6 0.1 - 1.0 K/uL   Eosinophils Relative 7 %   Eosinophils Absolute 0.4 0.0 - 0.5 K/uL   Basophils Relative 1 %   Basophils Absolute 0.0 0.0 - 0.1 K/uL   Immature Granulocytes 0 %   Abs Immature Granulocytes 0.01 0.00 - 0.07 K/uL    Comment: Performed at [facility], [place] [address], [place], [place] [address]    RADIOGRAPHIC STUDIES: No  relevant radiographic studies.  ASSESSMENT & PLAN Tyler Horne 78 y.o. male with medical history significant for COPD on home O2, GERD, and postnasal drip who presents for f/u of a new normocytic anemia. The patient was seen by his PCP on 02/10/19 at which time he was noted to have a Hgb drop from 14 to 12.2. There were no further outside records available for review.   Labs at his initial visit showed Hgb 11.5, WBC 6.6, Plt 301. Iron 70, TIBC 429, Sat 16% ferritin 16.  These findings are consistent with an iron deficiency anemia.  He was recommended to start on iron 325 mg p.o. daily with a source of vitamin C.  Unfortunately the patient has been taking this medication twice daily.  We corrected him on the use of iron today.  The treatment of his iron deficiency anemia will initially require identification of the source of the blood loss.  We have placed a referral to GI for consideration of endoscopy to further evaluate this.  Additionally we will try to replete his iron orally.  Fortunately at this time his anemia is mild as his hemoglobin is marginally below the reference range.  We will continue to monitor him clinically with a follow-up visit in 2 months time.  #Normocytic Anemia --labs at last visit were consistent with an iron deficiency anemia, with an iron sat 16% and ferritin of 16.  --work up for alternative etiologies of his anemia was negative, included B12, folate, TSH, and erythropoietin.  --with normocytic anemia and iron deficiency, GI evaluation is warranted. Referral placed to [place] and patient's brother given their contact number.  --continue iron pills 325mg  PO daily with a source of vitamin C. Instructed on proper way to take these pills today.  --RTC scheduled in 2 month time to reassess.   All questions were answered. The patient knows to call the clinic with any problems, questions or concerns.  A total of more than 25 minutes were spent face-to-face with the patient  during this encounter and over half of that time was spent on counseling and coordination of care as outlined above.   Ledell Peoples, MD Department of Hematology/Oncology [place] at [facility] Phone: [phone] Pager: [phone] Email: [email or web address]  04/24/2019 4:09 PM

## 2019-04-27 LAB — IRON AND TIBC
Iron: 71 ug/dL (ref 42–163)
Saturation Ratios: 19 % — ABNORMAL LOW (ref 20–55)
TIBC: 369 ug/dL (ref 202–409)
UIBC: 298 ug/dL (ref 117–376)

## 2019-04-27 LAB — FERRITIN: Ferritin: 15 ng/mL — ABNORMAL LOW (ref 24–336)

## 2019-05-05 ENCOUNTER — Other Ambulatory Visit: Payer: Self-pay

## 2019-05-05 ENCOUNTER — Ambulatory Visit (INDEPENDENT_AMBULATORY_CARE_PROVIDER_SITE_OTHER): Payer: Medicare PPO | Admitting: Gastroenterology

## 2019-05-05 ENCOUNTER — Encounter: Payer: Self-pay | Admitting: Gastroenterology

## 2019-05-05 VITALS — BP 116/60 | HR 88 | Temp 97.9°F | Ht 67.0 in | Wt 135.4 lb

## 2019-05-05 DIAGNOSIS — R194 Change in bowel habit: Secondary | ICD-10-CM

## 2019-05-05 DIAGNOSIS — D649 Anemia, unspecified: Secondary | ICD-10-CM

## 2019-05-05 DIAGNOSIS — D509 Iron deficiency anemia, unspecified: Secondary | ICD-10-CM

## 2019-05-05 MED ORDER — SUPREP BOWEL PREP KIT 17.5-3.13-1.6 GM/177ML PO SOLN: 1.0000 | ORAL | 0 refills | Status: DC

## 2019-05-05 NOTE — Progress Notes (Signed)
[facility] VISIT   Primary Care Provider Lorene Dy, MD [address] [place] [address] [phone]  Referring Provider Lorene Dy, MD [address] [place],  [place] [address] [phone]  Patient Profile: Alejandro Gamel is a 78 y.o. male with a pmh significant for COPD (on 2L home O2), HTN, Tobacco Use disorder, GERD, OA, DJD, Anxiety, Allergies.  The patient presents to the [facility] for an evaluation and management of problem(s) noted below:  Problem List 1. Iron deficiency anemia, unspecified iron deficiency anemia type   2. Normocytic anemia   3. Change in bowel habit     History of Present Illness This is the patient's first visit to the outpatient [facility].  The patient is referred for evaluation of anemia without microcytosis and a relative iron deficiency.  He is accompanied by his brother today.  The patient states that he mostly has brown bowel movements and sometimes has mucus associated with it.  He has infrequent abdominal pain that comes and goes.  He has had alternating issues with constipation and diarrhea over the course of many years.  He sometimes will have a normal bowel movement for a few days and then become constipated and then to have diarrhea and this has been ongoing for many years.  He does not recount having any bright red blood per rectum.  He states he has had a previous colonoscopy but it was greater than 20 years ago but he has been "tested via stool test per his report".  The patient denies significant acid reflux or pyrosis.  Patient denies any dysphagia or odynophagia.  The patient has never had an upper endoscopy.  GI Review of Systems Positive as above Negative for current abdominal pain, bloating, melena, hematochezia  Review of Systems General: Denies fevers/chills/weight loss/ HEENT: Denies oral lesions Cardiovascular: Denies chest pain/palpitations Pulmonary:  Longstanding chronic shortness of breath due to his underlying pulmonary disease (not worse than normal)  Gastroenterological: See HPI Genitourinary: Denies darkened urine or hematuria Hematological: Denies easy bruising/bleeding Endocrine: Denies temperature intolerance Dermatological: Denies jaundice Psychological: Mood is stable   Medications Current Outpatient Medications  Medication Sig Dispense Refill  . acetaminophen (TYLENOL) 325 MG tablet Take 650 mg by mouth 2 times daily at 12 noon and 4 pm.    . albuterol (VENTOLIN HFA) 108 (90 Base) MCG/ACT inhaler INHALE 2 PUFFS INTO THE LUNGS EVERY 4 HOURS AS NEEDED FOR WHEEZE OR SHORTNESS OF BREATH. 18 Inhaler 2  . ALPRAZolam (XANAX) 0.25 MG tablet Take 0.25 mg by mouth 2 (two) times daily as needed for anxiety.    . Cyanocobalamin 1000 MCG CAPS Take by mouth every morning.    . diphenhydrAMINE (BENADRYL) 25 mg capsule Take 25 mg by mouth every 6 (six) hours as needed for allergies.    [name] docusate sodium 100 MG CAPS Take 100 mg by mouth 2 (two) times daily. 60 capsule 1  . Ensure Plus (ENSURE PLUS) LIQD Take 237 mLs by mouth daily as needed (for vitamin consumption).    [name] Ephedrine-Guaifenesin (PRIMATENE ASTHMA) 12.5-200 MG TABS Take 0.25 tablets by mouth daily as needed (for allergies).    . ferrous sulfate 325 (65 FE) MG EC tablet Take 1 tablet (325 mg total) by mouth daily with breakfast. Take with orange juice of vitamin C to help absorption. 90 tablet 1  . ipratropium-albuterol (DUONEB) 0.5-2.5 (3) MG/3ML SOLN USE 3 MLS BY NEBULIZATION 4 (FOUR) TIMES DAILY. 360 mL 5  .  meclizine (ANTIVERT) 25 MG tablet TAKE 1/2 TAB BY MOUTH 3 TIMES A DAY FOR BALANCE    . Multiple Vitamins-Minerals (CENTRUM SILVER ADULT 50+) TABS Take 1 tablet by mouth daily.    . OXYGEN Inhale 2 L/min into the lungs continuous.    . sodium chloride (OCEAN) 0.65 % nasal spray Place 1 spray into the nose as needed for congestion.    . tamsulosin (FLOMAX) 0.4 MG CAPS  capsule Take 0.4 mg by mouth daily.    . Tiotropium Bromide-Olodaterol (STIOLTO RESPIMAT) 2.5-2.5 MCG/ACT AERS Inhale 2 puffs into the lungs daily. 2 Inhaler 0  . traMADol (ULTRAM) 50 MG tablet Take by mouth every 12 (twelve) hours as needed.    . Na Sulfate-K Sulfate-Mg Sulf (SUPREP BOWEL PREP KIT) 17.5-3.13-1.6 GM/177ML SOLN Take 1 kit by mouth as directed. For colonoscopy prep 354 mL 0   No current facility-administered medications for this visit.     Allergies No Known Allergies  Histories Past Medical History:  Diagnosis Date  . Anemia   . Arthritis    DDD, shoulder  . Asthma    hayfever, seasonal allergies, + smoker, uses primatene pill on occas.   [name] GERD (gastroesophageal reflux disease)    uses Rolaids on rare occas.   [name] Postnasal drip    uses Benadryl & primatene as needed    Past Surgical History:  Procedure Laterality Date  . GANGLION CYST EXCISION Left    L hand   . LUMBAR LAMINECTOMY/DECOMPRESSION MICRODISCECTOMY Left 10/02/2012   Procedure: LUMBAR LAMINECTOMY/DECOMPRESSION MICRODISCECTOMY 2 LEVELS;  Surgeon: Ophelia Charter, MD;  Location: [facility];  Service: Neurosurgery;  Laterality: Left;  Left Lumbar four-five Lumbar five sacral one diskectomy  . PILONIDAL CYST EXCISION    . SHOULDER ARTHROSCOPY  2008   Left  . TONSILLECTOMY     Social History   Socioeconomic History  . Marital status: Single    Spouse name: Not on file  . Number of children: 0  . Years of education: Not on file  . Highest education level: Not on file  Occupational History  . 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 Financial resource strain: Not on file  . Food insecurity    Worry: Not on file    Inability: Not on file  . Transportation needs    Medical: Not on file    Non-medical: Not on file  Tobacco Use  . Smoking status: Current Some Day Smoker    Packs/day: 0.50    Years: 50.00    Pack years: 25.00    Types: Cigarettes  . Smokeless tobacco: Never Used   . Tobacco comment: Pt. states that he is down to 1-2 cigs a week  Substance and Sexual Activity  . Alcohol use: Yes    Comment: occasional  . Drug use: No  . Sexual activity: Not on file  Lifestyle  . Physical activity    Days per week: Not on file    Minutes per session: Not on file  . Stress: Not on file  Relationships  . Social Herbalist on phone: Not on file    Gets together: Not on file    Attends religious service: Not on file    Active member of club or organization: Not on file    Attends meetings of clubs or organizations: Not on file    Relationship status: Not on file  . Intimate partner violence    Fear of current or ex  partner: Not on file    Emotionally abused: Not on file    Physically abused: Not on file    Forced sexual activity: Not on file  Other Topics Concern  . Not on file  Social History Narrative  . Not on file   Family History  Problem Relation Age of Onset  . Cancer Mother        type unknown  . Dementia Father   . Prostate cancer Father   . Diabetes Mellitus II Brother   . Colon cancer Neg Hx   . Esophageal cancer Neg Hx   . Inflammatory bowel disease Neg Hx   . Liver disease Neg Hx   . Pancreatic cancer Neg Hx   . Rectal cancer Neg Hx   . Stomach cancer Neg Hx    I have reviewed his medical, social, and family history in detail and updated the electronic medical record as necessary.    PHYSICAL EXAMINATION  BP 116/60 (BP Location: Left Arm, Patient Position: Sitting, Cuff Size: Normal)   Pulse 88   Temp 97.9 F (36.6 C)   Ht _0  (1.702 m)   Wt 135 lb 6 oz (61.4 kg)   BMI 21.20 kg/m  Wt Readings from Last 3 Encounters:  05/05/19 135 lb 6 oz (61.4 kg)  04/24/19 134 lb 8 oz (61 kg)  03/27/19 136 lb 11.2 oz (62 kg)  GEN: NAD, appears stated age, doesn't appear chronically ill, accompanied by brother PSYCH: Cooperative, though tangential in his discussion at times EYE: Conjunctivae pink, sclerae anicteric ENT: MMM,  without oral ulcers NECK: Supple CV: RR without R/Gs  RESP: Wheezing appreciated throughout the lung exam GI: NABS, soft, NT/ND, without rebound or guarding, no HSM appreciated GU: DRE deferred since he will be undergoing a colonoscopy per his desire MSK/EXT: Bilateral lower extremity edema trace noted SKIN: No jaundice NEURO:  Alert & Oriented x 3, no focal deficits, in wheelchair   REVIEW OF DATA  I reviewed the following data at the time of this encounter:  GI Procedures and Studies  Colonoscopy >20 years ago per report  Laboratory Studies  Reviewed that in [facility]  Imaging Studies  2017 Abdominal U/S IMPRESSION: 1. No gallstones.  No ductal dilatation. 2. No hepatic abnormality is seen. 3. Much of the pancreas and abdominal aorta is obscured by bowel gas.   ASSESSMENT  Mr. Sistrunk is a 78 y.o. male with a pmh significant for COPD (on 2L home O2), HTN, Tobacco Use disorder, GERD, OA, DJD, Anxiety, Allergies.  The patient is seen today for evaluation and management of:  1. Iron deficiency anemia, unspecified iron deficiency anemia type   2. Normocytic anemia   3. Change in bowel habit    Hemodynamically and clinically the patient seems to be stable.  He is an increased risk individual due to his home oxygen use and his underlying COPD.  He does have a normocytic anemia with evidence of some iron deficiency based on his iron indices.  I think he will benefit from an upper and lower endoscopic evaluation.  The risks and benefits of endoscopic evaluation were discussed with the patient; these include but are not limited to the risk of perforation, infection, bleeding, missed lesions, lack of diagnosis, severe illness requiring hospitalization, as well as anesthesia and sedation related illnesses.  The patient and his brother are agreeable to proceed.  These procedures will have to be done in the hospital-based setting.  Thankfully his most recent hemoglobin is not  dangerously low.  We  will recheck that and ensure that his hemoglobin is stable and if it is stable then we will plan to get him scheduled on next available basis in the hospital in the coming weeks.  If there is a significant decline will consider earlier endoscopic evaluation.  He will continue oral iron.  If he remains iron deficient after our work-up then we will consider the role of IV iron infusion.  Video capsule endoscopy is considered at some point if he remains iron deficient after IV iron infusion and oral iron intake.  Hopefully we will not get to that.  Issues remain without a underlying diagnosis hematology referral may be required.  All patient questions were answered, to the best of my ability, and the patient agrees to the aforementioned plan of action with follow-up as indicated.   PLAN  Laboratories as outlined below Diagnostic endoscopy with gastric/duodenal biopsies at minimum Colonoscopy for screening and evaluation of iron deficiency -Procedures will need to be performed in the hospital-based setting Continue oral iron daily for now and will consider role of increasing depending on symptoms although his constipation alternating with diarrhea may make this more difficult to be on higher dose iron and consider role of IV iron in future Hold on video capsule endoscopy for now   Orders Placed This Encounter  Procedures  . Procedural/ Surgical Case Request: COLONOSCOPY WITH PROPOFOL, ESOPHAGOGASTRODUODENOSCOPY (EGD) WITH PROPOFOL  . IBC + Ferritin  . CBC  . IgA  . Tissue transglutaminase, IgA  . INR/PT  . Ambulatory referral to Gastroenterology    New Prescriptions   NA SULFATE-K SULFATE-MG SULF (SUPREP BOWEL PREP KIT) 17.5-3.13-1.6 GM/177ML SOLN    Take 1 kit by mouth as directed. For colonoscopy prep   Modified Medications   No medications on file    Planned Follow Up No follow-ups on file.   Justice Britain, MD [facility] Advanced Endoscopy Office # [phone]

## 2019-05-05 NOTE — Patient Instructions (Signed)
Your provider has requested that you go to the basement level for lab work 1-2 weeks before procedure. Press "B" on the elevator. The lab is located at the first door on the left as you exit the elevator.  You have been scheduled for an endoscopy and colonoscopy. Please follow the written instructions given to you at your visit today. Please pick up your prep supplies at the pharmacy within the next 1-3 days. If you use inhalers (even only as needed), please bring them with you on the day of your procedure.  Thank you for choosing me and [facility].  Dr. Rush Landmark

## 2019-05-07 DIAGNOSIS — D649 Anemia, unspecified: Secondary | ICD-10-CM | POA: Insufficient documentation

## 2019-05-07 DIAGNOSIS — D509 Iron deficiency anemia, unspecified: Secondary | ICD-10-CM | POA: Insufficient documentation

## 2019-05-07 DIAGNOSIS — R194 Change in bowel habit: Secondary | ICD-10-CM | POA: Insufficient documentation

## 2019-06-02 ENCOUNTER — Telehealth: Payer: Self-pay | Admitting: Hematology and Oncology

## 2019-06-02 NOTE — Telephone Encounter (Signed)
Scheduled per 11/20 los. Called and spoke with Shanon Brow (brother), confirmed 1/21 appt

## 2019-06-08 DIAGNOSIS — R69 Illness, unspecified: Secondary | ICD-10-CM | POA: Diagnosis not present

## 2019-06-15 ENCOUNTER — Other Ambulatory Visit (HOSPITAL_COMMUNITY)
Admission: RE | Admit: 2019-06-15 | Discharge: 2019-06-15 | Disposition: A | Discharge status: Home / Self Care | Payer: Medicare HMO | Source: Ambulatory Visit | Attending: Gastroenterology | Admitting: Gastroenterology

## 2019-06-15 DIAGNOSIS — Z20822 Contact with and (suspected) exposure to covid-19: Secondary | ICD-10-CM | POA: Insufficient documentation

## 2019-06-15 DIAGNOSIS — Z01812 Encounter for preprocedural laboratory examination: Secondary | ICD-10-CM | POA: Insufficient documentation

## 2019-06-16 LAB — NOVEL CORONAVIRUS, NAA (HOSP ORDER, SEND-OUT TO REF LAB; TAT 18-24 HRS): SARS-CoV-2, NAA: NOT DETECTED

## 2019-06-17 ENCOUNTER — Encounter (HOSPITAL_COMMUNITY): Payer: Self-pay | Admitting: Gastroenterology

## 2019-06-17 ENCOUNTER — Other Ambulatory Visit: Payer: Self-pay

## 2019-06-17 NOTE — Progress Notes (Signed)
Pre call for procedure tomorrow done, patient has remained quarantine since covid test. All questions addressed, patient to arrive to hospital by 0630 am.

## 2019-06-17 NOTE — Progress Notes (Signed)
Pt SDW-pre-op call completed by both pt and his brother Dr. Lajean Manes ([place]) . Pt stated that he is on continuous supplemental oxygen. Pt denies having a cardiac history. Brother made aware to have pt stop taking Aspirin (unless otherwise advised by surgeon), vitamins, fish oil and herbal medications. Do not take any NSAIDs ie: Ibuprofen, Advil, Naproxen (Aleve), Motrin, BC and Goody Powder. Pt reminded to quarantine. Brother verbalized understanding of all pre-op instructions.

## 2019-06-18 ENCOUNTER — Ambulatory Visit (HOSPITAL_COMMUNITY): Payer: Medicare HMO | Admitting: Certified Registered"

## 2019-06-18 ENCOUNTER — Encounter (HOSPITAL_COMMUNITY): Payer: Self-pay | Admitting: Gastroenterology

## 2019-06-18 ENCOUNTER — Encounter (HOSPITAL_COMMUNITY)
Admission: RE | Disposition: A | Discharge status: Home / Self Care | Payer: Self-pay | Source: Home / Self Care | Attending: Gastroenterology

## 2019-06-18 ENCOUNTER — Other Ambulatory Visit: Payer: Self-pay | Admitting: Physician Assistant

## 2019-06-18 ENCOUNTER — Ambulatory Visit (HOSPITAL_COMMUNITY)
Admission: RE | Admit: 2019-06-18 | Discharge: 2019-06-18 | Disposition: A | Discharge status: Home / Self Care | Payer: Medicare HMO | Attending: Gastroenterology | Admitting: Gastroenterology

## 2019-06-18 DIAGNOSIS — K621 Rectal polyp: Secondary | ICD-10-CM | POA: Diagnosis not present

## 2019-06-18 DIAGNOSIS — R195 Other fecal abnormalities: Secondary | ICD-10-CM | POA: Insufficient documentation

## 2019-06-18 DIAGNOSIS — D509 Iron deficiency anemia, unspecified: Secondary | ICD-10-CM | POA: Insufficient documentation

## 2019-06-18 DIAGNOSIS — Z87891 Personal history of nicotine dependence: Secondary | ICD-10-CM | POA: Diagnosis not present

## 2019-06-18 DIAGNOSIS — K2950 Unspecified chronic gastritis without bleeding: Secondary | ICD-10-CM | POA: Insufficient documentation

## 2019-06-18 DIAGNOSIS — R194 Change in bowel habit: Secondary | ICD-10-CM | POA: Diagnosis not present

## 2019-06-18 DIAGNOSIS — Z9981 Dependence on supplemental oxygen: Secondary | ICD-10-CM | POA: Diagnosis not present

## 2019-06-18 DIAGNOSIS — M1990 Unspecified osteoarthritis, unspecified site: Secondary | ICD-10-CM | POA: Diagnosis not present

## 2019-06-18 DIAGNOSIS — K3189 Other diseases of stomach and duodenum: Secondary | ICD-10-CM

## 2019-06-18 DIAGNOSIS — K641 Second degree hemorrhoids: Secondary | ICD-10-CM | POA: Diagnosis not present

## 2019-06-18 DIAGNOSIS — J449 Chronic obstructive pulmonary disease, unspecified: Secondary | ICD-10-CM | POA: Insufficient documentation

## 2019-06-18 HISTORY — PX: ESOPHAGOGASTRODUODENOSCOPY (EGD) WITH PROPOFOL: SHX5813

## 2019-06-18 HISTORY — PX: COLONOSCOPY WITH PROPOFOL: SHX5780

## 2019-06-18 HISTORY — PX: POLYPECTOMY: SHX5525

## 2019-06-18 HISTORY — DX: Chronic obstructive pulmonary disease, unspecified: J44.9

## 2019-06-18 HISTORY — PX: BIOPSY: SHX5522

## 2019-06-18 HISTORY — DX: Presence of dental prosthetic device (complete) (partial): Z97.2

## 2019-06-18 HISTORY — DX: Dependence on supplemental oxygen: Z99.81

## 2019-06-18 SURGERY — COLONOSCOPY WITH PROPOFOL
Anesthesia: Monitor Anesthesia Care

## 2019-06-18 MED ORDER — SODIUM CHLORIDE 0.9 % IV SOLN: INTRAVENOUS | Status: DC

## 2019-06-18 MED ORDER — PROPOFOL 500 MG/50ML IV EMUL
INTRAVENOUS | Status: DC | PRN
  Administered 2019-06-18 (×2): 125 ug/kg/min via INTRAVENOUS

## 2019-06-18 MED ORDER — PHENYLEPHRINE 40 MCG/ML (10ML) SYRINGE FOR IV PUSH (FOR BLOOD PRESSURE SUPPORT)
PREFILLED_SYRINGE | INTRAVENOUS | Status: DC | PRN
  Administered 2019-06-18 (×2): 200 ug via INTRAVENOUS

## 2019-06-18 MED ORDER — PROPOFOL 10 MG/ML IV BOLUS
INTRAVENOUS | Status: DC | PRN
  Administered 2019-06-18: 50 mg via INTRAVENOUS

## 2019-06-18 MED ORDER — LACTATED RINGERS IV SOLN: INTRAVENOUS | Status: DC

## 2019-06-18 SURGICAL SUPPLY — 24 items

## 2019-06-18 NOTE — Op Note (Signed)
[facility] Patient Name: Tyler Horne Procedure Date : 06/18/2019 MRN: [id number] Attending MD: Justice Britain , MD Date of Birth: 1941-03-22 CSN: [id number] Age: 79 Admit Type: Inpatient Procedure:                Upper GI endoscopy Indications:              Iron deficiency anemia Providers:                Justice Britain, MD, Vista Lawman, RN, Lazaro Arms, Technician Referring MD:              Medicines:                Monitored Anesthesia Care Complications:            No immediate complications. Estimated Blood Loss:     Estimated blood loss was minimal. Procedure:                Pre-Anesthesia Assessment:                           - Prior to the procedure, a History and Physical                            was performed, and patient medications and                            allergies were reviewed. The patient's tolerance of                            previous anesthesia was also reviewed. The risks                            and benefits of the procedure and the sedation                            options and risks were discussed with the patient.                            All questions were answered, and informed consent                            was obtained. Prior Anticoagulants: The patient has                            taken no previous anticoagulant or antiplatelet                            agents. ASA Grade Assessment: III - A patient with                            severe systemic disease. After reviewing the risks  and benefits, the patient was deemed in                            satisfactory condition to undergo the procedure.                           After obtaining informed consent, the endoscope was                            passed under direct vision. Throughout the                            procedure, the patient's blood pressure, pulse, and                            oxygen  saturations were monitored continuously. The                            [id number]) Olympus gastroscope was                            introduced through the mouth, and advanced to the                            second part of duodenum. The upper GI endoscopy was                            accomplished without difficulty. The patient                            tolerated the procedure. Findings:      No gross lesions were noted in the entire esophagus.      The Z-line was regular and was found 43 cm from the incisors.      Patchy mildly erythematous mucosa without bleeding was found in the       gastric body. Biopsies were taken with a cold forceps for histology and       Helicobacter pylori testing.      No other gross lesions were noted in the entire examined stomach.      No gross lesions were noted in the duodenal bulb, in the first portion       of the duodenum and in the second portion of the duodenum. Biopsies for       histology were taken with a cold forceps for evaluation of celiac       disease. Impression:               - No gross lesions in esophagus.                           - Z-line regular, 43 cm from the incisors.                           - Erythematous mucosa in the gastric body. Biopsied.                           -  No gross lesions in the stomach.                           - No gross lesions in the duodenal bulb, in the                            first portion of the duodenum and in the second                            portion of the duodenum. Biopsied. Recommendation:           - Proceed to scheduled colonoscopy.                           - Observe patient's clinical course.                           - Await pathology results.                           - Based on findings on biopsies will consider role                            of PPI therapy.                           - The findings and recommendations were discussed                            with the  patient.                           - The findings and recommendations were discussed                            with the patient's family. Procedure Code(s):        --- Professional ---                           [phone], Esophagogastroduodenoscopy, flexible,                            transoral; with biopsy, single or multiple Diagnosis Code(s):        --- Professional ---                           K31.89, Other diseases of stomach and duodenum                           D50.9, Iron deficiency anemia, unspecified CPT copyright 2019 American Medical Association. All rights reserved. The codes documented in this report are preliminary and upon coder review may  be revised to meet current compliance requirements. Justice Britain, MD 06/18/2019 8:53:41 AM Number of Addenda: 0

## 2019-06-18 NOTE — Anesthesia Postprocedure Evaluation (Signed)
Anesthesia Post Note  Patient: Cedarius Kersh  Procedure(s) Performed: COLONOSCOPY WITH PROPOFOL (N/A ) ESOPHAGOGASTRODUODENOSCOPY (EGD) WITH PROPOFOL (N/A ) BIOPSY POLYPECTOMY     Patient location during evaluation: Phase II Anesthesia Type: MAC Level of consciousness: awake Pain management: pain level controlled Vital Signs Assessment: post-procedure vital signs reviewed and stable Respiratory status: spontaneous breathing Cardiovascular status: stable Postop Assessment: no apparent nausea or vomiting Anesthetic complications: no    Last Vitals:  Vitals:   06/18/19 0915 06/18/19 0920  BP:    Pulse: 68 70  Resp: (!) 21 20  Temp:  36.5 C  SpO2: 96% 96%    Last Pain:  Vitals:   06/18/19 0920  TempSrc:   PainSc: 0-No pain   Pain Goal:                   Huston Foley

## 2019-06-18 NOTE — Op Note (Signed)
[facility] Patient Name: Tyler Horne Procedure Date : 06/18/2019 MRN: [id number] Attending MD: Justice Britain , MD Date of Birth: March 09, 1941 CSN: [id number] Age: 79 Admit Type: Inpatient Procedure:                Colonoscopy Indications:              Iron deficiency anemia Providers:                Justice Britain, MD, Vista Lawman, RN, Lazaro Arms, Technician Referring MD:              Medicines:                Monitored Anesthesia Care Complications:            No immediate complications. Estimated Blood Loss:     Estimated blood loss was minimal. Procedure:                Pre-Anesthesia Assessment:                           - Prior to the procedure, a History and Physical                            was performed, and patient medications and                            allergies were reviewed. The patient's tolerance of                            previous anesthesia was also reviewed. The risks                            and benefits of the procedure and the sedation                            options and risks were discussed with the patient.                            All questions were answered, and informed consent                            was obtained. Prior Anticoagulants: The patient has                            taken no previous anticoagulant or antiplatelet                            agents. ASA Grade Assessment: III - A patient with                            severe systemic disease. After reviewing the risks  and benefits, the patient was deemed in                            satisfactory condition to undergo the procedure.                           After obtaining informed consent, the colonoscope                            was passed under direct vision. Throughout the                            procedure, the patient's blood pressure, pulse, and                            oxygen saturations  were monitored continuously. The                            PCF-H190DL ([id number] Olympus pediatric colonscope                            was introduced through the anus and advanced to the                            5 cm into the ileum. The colonoscopy was                            technically difficult and complex due to poor bowel                            prep and significant looping. Successful completion                            of the procedure was aided by changing the                            patient's position, using manual pressure,                            withdrawing and reinserting the scope,                            straightening and shortening the scope to obtain                            bowel loop reduction and using scope torsion. The                            patient tolerated the procedure. The quality of the                            bowel preparation was adequate. The terminal ileum,  ileocecal valve, appendiceal orifice, and rectum                            were photographed. Scope In: 8:07:56 AM Scope Out: 8:37:48 AM Scope Withdrawal Time: 0 hours 16 minutes 21 seconds  Total Procedure Duration: 0 hours 29 minutes 52 seconds  Findings:      The digital rectal exam findings include hemorrhoids. Pertinent       negatives include no palpable rectal lesions.      The terminal ileum and ileocecal valve appeared normal.      A large amount of semi-liquid black stool was found in the entire colon,       interfering with visualization. Lavage of the area was performed using       copious amounts, resulting in clearance with adequate visualization.       This is likely a result of Iron continuing to be taken.      The colon (entire examined portion) revealed significantly excessive       looping.      A 3 mm polyp was found in the rectum. The polyp was sessile. The polyp       was removed with a cold snare. Resection and retrieval  were complete.      Normal mucosa was found in the entire colon otherwise.      Non-bleeding non-thrombosed internal hemorrhoids were found during       retroflexion, during perianal exam and during digital exam. The       hemorrhoids were Grade II (internal hemorrhoids that prolapse but reduce       spontaneously). Impression:               - Hemorrhoids found on digital rectal exam.                           - The examined portion of the ileum was normal.                           - Stool in the entire examined colon. Lavaged with                            adequate preparation.                           - There was significant looping of the colon.                           - One 3 mm polyp in the rectum, removed with a cold                            snare. Resected and retrieved.                           - Normal mucosa in the entire examined colon                            otherwise.                           -   Non-bleeding non-thrombosed internal hemorrhoids. Recommendation:           - The patient will be observed post-procedure,                            until all discharge criteria are met.                           - Discharge patient to home.                           - Patient has a contact number available for                            emergencies. The signs and symptoms of potential                            delayed complications were discussed with the                            patient. Return to normal activities tomorrow.                            Written discharge instructions were provided to the                            patient.                           - High fiber diet.                           - Continue present medications.                           - Await pathology results.                           - Repeat colonoscopy for surveillance based on                            pathology results.                           - Will have patient return to  office in 4-weeks for                            repeat CBC/Iron/TIBC/Ferritin/Reticulocyte count to                            evaluate for persistence of IDA or not. May require                            a VCE if remains deficient as well a IV Iron  Infusions.                           - The findings and recommendations were discussed                            with the patient.                           - The findings and recommendations were discussed                            with the patient's family. Procedure Code(s):        --- Professional ---                           45385, Colonoscopy, flexible; with removal of                            tumor(s), polyp(s), or other lesion(s) by snare                            technique Diagnosis Code(s):        --- Professional ---                           K64.1, Second degree hemorrhoids                           K62.1, Rectal polyp                           D50.9, Iron deficiency anemia, unspecified CPT copyright 2019 American Medical Association. All rights reserved. The codes documented in this report are preliminary and upon coder review may  be revised to meet current compliance requirements. Gabriel Mansouraty, MD 06/18/2019 9:06:56 AM Number of Addenda: 0 

## 2019-06-18 NOTE — H&P (Signed)
GASTROENTEROLOGY PROCEDURE H&P NOTE   Primary Care Physician: Lorene Dy, MD  HPI: [name] is a 79 y.o. male who presents for EGD/Colonoscopy.  Past Medical History:  Diagnosis Date  . Anemia   . Arthritis    DDD, shoulder  . Asthma    hayfever, seasonal allergies, + smoker, uses primatene pill on occas.   [name] COPD (chronic obstructive pulmonary disease) ([place])   . GERD (gastroesophageal reflux disease)    uses Rolaids on rare occas.   [name] Postnasal drip    uses Benadryl & primatene as needed   . Supplemental oxygen dependent    continuous  . Wears dentures    Past Surgical History:  Procedure Laterality Date  . GANGLION CYST EXCISION Left    L hand   . LUMBAR LAMINECTOMY/DECOMPRESSION MICRODISCECTOMY Left 10/02/2012   Procedure: LUMBAR LAMINECTOMY/DECOMPRESSION MICRODISCECTOMY 2 LEVELS;  Surgeon: Ophelia Charter, MD;  Location: [facility];  Service: Neurosurgery;  Laterality: Left;  Left Lumbar four-five Lumbar five sacral one diskectomy  . PILONIDAL CYST EXCISION    . SHOULDER ARTHROSCOPY  2008   Left  . TONSILLECTOMY     Current Facility-Administered Medications  Medication Dose Route Frequency Provider Last Rate Last Admin  . 0.9 %  sodium chloride infusion   Intravenous Continuous Mansouraty, Telford Nab., MD      . lactated ringers infusion   Intravenous Continuous Mansouraty, Telford Nab., MD 20 mL/hr at 06/18/19 0721 New Bag at 06/18/19 0721   No Known Allergies Family History  Problem Relation Age of Onset  . Cancer Mother        type unknown  . Dementia Father   . Prostate cancer Father   . Diabetes Mellitus II Brother   . Colon cancer Neg Hx   . Esophageal cancer Neg Hx   . Inflammatory bowel disease Neg Hx   . Liver disease Neg Hx   . Pancreatic cancer Neg Hx   . Rectal cancer Neg Hx   . Stomach cancer Neg Hx    Social History   Socioeconomic History  . Marital status: Single    Spouse name: Not on file  . Number of children: 0  . Years  of education: Not on file  . Highest education level: Not on file  Occupational History  . 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 Smoking status: Former Smoker    Packs/day: 0.50    Years: 50.00    Pack years: 25.00    Types: Cigarettes    Quit date: 09/19/2018    Years since quitting: 0.7  . Smokeless tobacco: Never Used  . Tobacco comment: Pt. states that he is down to 1-2 cigs a week  Substance and Sexual Activity  . Alcohol use: Yes    Comment: seldom  . Drug use: No  . Sexual activity: Not on file  Other Topics Concern  . Not on file  Social History Narrative  . Not on file   Social Determinants of Health   Financial Resource Strain:   . Difficulty of Paying Living Expenses: Not on file  Food Insecurity:   . Worried About Charity fundraiser in the Last Year: Not on file  . Ran Out of Food in the Last Year: Not on file  Transportation Needs:   . Lack of Transportation (Medical): Not on file  . Lack of Transportation (Non-Medical): Not on file  Physical Activity:   . Days of Exercise per Week: Not on file  .  Minutes of Exercise per Session: Not on file  Stress:   . Feeling of Stress : Not on file  Social Connections:   . Frequency of Communication with Friends and Family: Not on file  . Frequency of Social Gatherings with Friends and Family: Not on file  . Attends Religious Services: Not on file  . Active Member of Clubs or Organizations: Not on file  . Attends Archivist Meetings: Not on file  . Marital Status: Not on file  Intimate Partner Violence:   . Fear of Current or Ex-Partner: Not on file  . Emotionally Abused: Not on file  . Physically Abused: Not on file  . Sexually Abused: Not on file    Physical Exam: Vital signs in last 24 hours: Temp:  [98.8 F (37.1 C)] 98.8 F (37.1 C) (01/14 0702) Pulse Rate:  [85] 85 (01/14 0702) BP: (145)/(81) 145/81 (01/14 0702) SpO2:  [100 %] 100 % (01/14 0702) Weight:  [61.7 kg] 61.7 kg  (01/14 0710)   GEN: NAD EYE: Sclerae anicteric ENT: MMM CV: Non-tachycardic GI: Soft, NT/ND NEURO:  Alert & Oriented x 3  Lab Results: No results for input(s): WBC, HGB, HCT, PLT in the last 72 hours. BMET No results for input(s): NA, K, CL, CO2, GLUCOSE, BUN, CREATININE, CALCIUM in the last 72 hours. LFT No results for input(s): PROT, ALBUMIN, AST, ALT, ALKPHOS, BILITOT, BILIDIR, IBILI in the last 72 hours. PT/INR No results for input(s): LABPROT, INR in the last 72 hours.   Impression / Plan: This is a 79 y.o.male who presents for EGD/Colonoscopy.  The risks and benefits of endoscopic evaluation were discussed with the patient; these include but are not limited to the risk of perforation, infection, bleeding, missed lesions, lack of diagnosis, severe illness requiring hospitalization, as well as anesthesia and sedation related illnesses.  The patient is agreeable to proceed.    Justice Britain, MD [facility] # [phone]

## 2019-06-18 NOTE — Anesthesia Preprocedure Evaluation (Signed)
Anesthesia Evaluation  Patient identified by MRN, date of birth, ID band Patient awake    Reviewed: Allergy & Precautions, NPO status , Patient's Chart, lab work & pertinent test results  Airway Mallampati: I       Dental no notable dental hx. (+) Teeth Intact   Pulmonary asthma , COPD,  oxygen dependent, former smoker,    Pulmonary exam normal breath sounds clear to auscultation       Cardiovascular Normal cardiovascular exam Rhythm:Regular Rate:Normal     Neuro/Psych negative neurological ROS  negative psych ROS   GI/Hepatic   Endo/Other    Renal/GU      Musculoskeletal   Abdominal Normal abdominal exam  (+)   Peds  Hematology   Anesthesia Other Findings PFT from 2018 reviewed  Reproductive/Obstetrics                             Anesthesia Physical Anesthesia Plan  ASA: III  Anesthesia Plan: MAC   Post-op Pain Management:    Induction:   PONV Risk Score and Plan: Propofol infusion  Airway Management Planned: Natural Airway and Mask  Additional Equipment: None  Intra-op Plan:   Post-operative Plan:   Informed Consent: I have reviewed the patients History and Physical, chart, labs and discussed the procedure including the risks, benefits and alternatives for the proposed anesthesia with the patient or authorized representative who has indicated his/her understanding and acceptance.       Plan Discussed with: CRNA  Anesthesia Plan Comments:         Anesthesia Quick Evaluation

## 2019-06-18 NOTE — Transfer of Care (Signed)
Immediate Anesthesia Transfer of Care Note  Patient: Mohammad Granade  Procedure(s) Performed: COLONOSCOPY WITH PROPOFOL (N/A ) ESOPHAGOGASTRODUODENOSCOPY (EGD) WITH PROPOFOL (N/A ) BIOPSY POLYPECTOMY  Patient Location: PACU  Anesthesia Type:MAC  Level of Consciousness: drowsy  Airway & Oxygen Therapy: Patient Spontanous Breathing and Patient connected to nasal cannula oxygen  Post-op Assessment: Report given to RN and Post -op Vital signs reviewed and stable  Post vital signs: Reviewed and stable  Last Vitals:  Vitals Value Taken Time  BP 115/72 06/18/19 0849  Temp    Pulse 95 06/18/19 0850  Resp 21 06/18/19 0850  SpO2 100 % 06/18/19 0850  Vitals shown include unvalidated device data.  Last Pain:  Vitals:   06/18/19 0702  TempSrc: Temporal  PainSc: 0-No pain         Complications: No apparent anesthesia complications

## 2019-06-19 LAB — SURGICAL PATHOLOGY

## 2019-06-23 DIAGNOSIS — J449 Chronic obstructive pulmonary disease, unspecified: Secondary | ICD-10-CM | POA: Diagnosis not present

## 2019-06-23 DIAGNOSIS — R062 Wheezing: Secondary | ICD-10-CM | POA: Diagnosis not present

## 2019-06-24 ENCOUNTER — Other Ambulatory Visit: Payer: Self-pay

## 2019-06-24 ENCOUNTER — Encounter: Payer: Self-pay | Admitting: Gastroenterology

## 2019-06-24 ENCOUNTER — Other Ambulatory Visit: Payer: Self-pay | Admitting: *Deleted

## 2019-06-24 DIAGNOSIS — D509 Iron deficiency anemia, unspecified: Secondary | ICD-10-CM

## 2019-06-24 MED ORDER — OMEPRAZOLE 20 MG PO CPDR: 20.0000 mg | DELAYED_RELEASE_CAPSULE | ORAL | 3 refills | Status: AC

## 2019-06-25 ENCOUNTER — Inpatient Hospital Stay: Payer: Medicare HMO | Attending: Hematology and Oncology | Admitting: Hematology and Oncology

## 2019-06-25 ENCOUNTER — Other Ambulatory Visit: Payer: Self-pay

## 2019-06-25 ENCOUNTER — Inpatient Hospital Stay: Payer: Medicare HMO

## 2019-06-25 VITALS — BP 112/86 | HR 91 | Temp 97.9°F | Resp 17 | Ht 67.0 in | Wt 136.1 lb

## 2019-06-25 DIAGNOSIS — D509 Iron deficiency anemia, unspecified: Secondary | ICD-10-CM

## 2019-06-25 DIAGNOSIS — D500 Iron deficiency anemia secondary to blood loss (chronic): Secondary | ICD-10-CM | POA: Diagnosis not present

## 2019-06-25 LAB — CBC WITH DIFFERENTIAL (CANCER CENTER ONLY)
Abs Immature Granulocytes: 0.01 10*3/uL (ref 0.00–0.07)
Basophils Absolute: 0.1 10*3/uL (ref 0.0–0.1)
Basophils Relative: 1 %
Eosinophils Absolute: 0.4 10*3/uL (ref 0.0–0.5)
Eosinophils Relative: 5 %
HCT: 35.9 % — ABNORMAL LOW (ref 39.0–52.0)
Hemoglobin: 11.6 g/dL — ABNORMAL LOW (ref 13.0–17.0)
Immature Granulocytes: 0 %
Lymphocytes Relative: 17 %
Lymphs Abs: 1.2 10*3/uL (ref 0.7–4.0)
MCH: 30.4 pg (ref 26.0–34.0)
MCHC: 32.3 g/dL (ref 30.0–36.0)
MCV: 94.2 fL (ref 80.0–100.0)
Monocytes Absolute: 0.6 10*3/uL (ref 0.1–1.0)
Monocytes Relative: 9 %
Neutro Abs: 4.8 10*3/uL (ref 1.7–7.7)
Neutrophils Relative %: 68 %
Platelet Count: 272 10*3/uL (ref 150–400)
RBC: 3.81 MIL/uL — ABNORMAL LOW (ref 4.22–5.81)
RDW: 13 % (ref 11.5–15.5)
WBC Count: 7.1 10*3/uL (ref 4.0–10.5)
nRBC: 0 % (ref 0.0–0.2)

## 2019-06-25 LAB — CMP (CANCER CENTER ONLY)
ALT: 19 U/L (ref 0–44)
AST: 21 U/L (ref 15–41)
Albumin: 3.9 g/dL (ref 3.5–5.0)
Alkaline Phosphatase: 85 U/L (ref 38–126)
Anion gap: 8 (ref 5–15)
BUN: 21 mg/dL (ref 8–23)
CO2: 29 mmol/L (ref 22–32)
Calcium: 9.1 mg/dL (ref 8.9–10.3)
Chloride: 100 mmol/L (ref 98–111)
Creatinine: 1.34 mg/dL — ABNORMAL HIGH (ref 0.61–1.24)
GFR, Est AFR Am: 58 mL/min — ABNORMAL LOW (ref >60)
GFR, Estimated: 50 mL/min — ABNORMAL LOW (ref >60)
Glucose, Bld: 116 mg/dL — ABNORMAL HIGH (ref 70–99)
Potassium: 4.4 mmol/L (ref 3.5–5.1)
Sodium: 137 mmol/L (ref 135–145)
Total Bilirubin: 0.3 mg/dL (ref 0.3–1.2)
Total Protein: 6.8 g/dL (ref 6.5–8.1)

## 2019-06-26 ENCOUNTER — Telehealth: Payer: Self-pay | Admitting: *Deleted

## 2019-06-26 ENCOUNTER — Encounter: Payer: Self-pay | Admitting: Hematology and Oncology

## 2019-06-26 LAB — IRON AND TIBC
Iron: 57 ug/dL (ref 42–163)
Saturation Ratios: 14 % — ABNORMAL LOW (ref 20–55)
TIBC: 398 ug/dL (ref 202–409)
UIBC: 340 ug/dL (ref 117–376)

## 2019-06-26 LAB — FERRITIN: Ferritin: 22 ng/mL — ABNORMAL LOW (ref 24–336)

## 2019-06-26 NOTE — Telephone Encounter (Signed)
TCT patient's brother, Shanon Brow.  Asked Shanon Brow if pt has been taking his iron medication.  Shanon Brow said the patient was not taking it as pt said it  'too expensive' at $56.  Duffy Rhody that Iron is not that expensive and to help pt double check the cost.  Reviewed iron prescription  and strength with him.  Informed Shanon Brow that pt's lab reveal no change in his iron studies or HGB. And that Dr. Lorenso Courier suspected pt was not taking his iron.  Advised that we will see pt back in 3 months to re-check his labs.  Shanon Brow voiced understanding and will work with patient on taking his iron.  Dr. Lorenso Courier made aware of the above.

## 2019-06-26 NOTE — Progress Notes (Signed)
Tyler Horne [phone]   [phone]  PROGRESS NOTE  Patient Care Team: Lorene Dy, MD as PCP - General (Internal Medicine)  Hematological/Oncological History  #Normocytic Anemia #Iron Deficiency Anemia 1) Establish care with Dr. Lorenso Courier on 03/27/19 due to Hgb drop 14-12.2 per outside records 2) 03/27/2019: Hgb 11.5, WBC 6.6, Plt 301. Iron 70, TIBC 429, Sat 16% ferritin 16 3) 04/24/2019: Hgb 11.5, WBC 6.0, Plt 298. Iron 71, TIBC 369, Sat 19%, ferritin 15 4) 06/18/2019: EGD and colonoscopy performed. Revealed internal hemorrhoids and 3 mm polyp.  5)  06/25/2019: Hgb 11.6, WBC 7.1, Plt 272 . Iron 57, TIBC 398, Sat 14%, Ferritin 22  Interval History:  Tyler Horne 79 y.o. male with medical history significant for iron deficiency anemia presents for a follow up visit. He was last seen on 04/24/2019. In the interim since his last visit he has undergone EGD and colonoscopy which revealed internal hemorrhoids as well as a 3 mm polyp.  No clear overt source of bleeding was identified on the exam.  On exam today Tyler Horne is accompanied by his brother.  He reports that he had been taking the iron as prescribed however he stopped taking the iron 2 to 3 days prior to his colonoscopy.  Of note on the colonoscopy report he was noted to have dark black stool which was likely secondary to the iron pills.  On further discussion he notes that he has felt well with no worsening shortness of breath or decline in his energy levels.  He continues to predominantly drink [facility] as his primary source of fluid intake.  He notes no other major issues since his last visit.  He denies any other overt sources of bleeding.  Full 10 point ROS is listed below.  MEDICAL HISTORY:  Past Medical History:  Diagnosis Date  . Anemia   . Arthritis    DDD, shoulder  . Asthma    hayfever, seasonal allergies, + smoker, uses primatene pill on occas.   [name] COPD (chronic obstructive  pulmonary disease) ([place])   . GERD (gastroesophageal reflux disease)    uses Rolaids on rare occas.   [name] Postnasal drip    uses Benadryl & primatene as needed   . Supplemental oxygen dependent    continuous  . Wears dentures     SURGICAL HISTORY: Past Surgical History:  Procedure Laterality Date  . BIOPSY  06/18/2019   Procedure: BIOPSY;  Surgeon: Rush Landmark Telford Nab., MD;  Location: [place];  Service: Gastroenterology;;  . COLONOSCOPY WITH PROPOFOL N/A 06/18/2019   Procedure: COLONOSCOPY WITH PROPOFOL;  Surgeon: Irving Copas., MD;  Location: [place];  Service: Gastroenterology;  Laterality: N/A;  . ESOPHAGOGASTRODUODENOSCOPY (EGD) WITH PROPOFOL N/A 06/18/2019   Procedure: ESOPHAGOGASTRODUODENOSCOPY (EGD) WITH PROPOFOL;  Surgeon: Rush Landmark Telford Nab., MD;  Location: [place];  Service: Gastroenterology;  Laterality: N/A;  . GANGLION CYST EXCISION Left    L hand   . LUMBAR LAMINECTOMY/DECOMPRESSION MICRODISCECTOMY Left 10/02/2012   Procedure: LUMBAR LAMINECTOMY/DECOMPRESSION MICRODISCECTOMY 2 LEVELS;  Surgeon: Ophelia Charter, MD;  Location: [place] [facility];  Service: Neurosurgery;  Laterality: Left;  Left Lumbar four-five Lumbar five sacral one diskectomy  . PILONIDAL CYST EXCISION    . POLYPECTOMY  06/18/2019   Procedure: POLYPECTOMY;  Surgeon: Mansouraty, Telford Nab., MD;  Location: [facility];  Service: Gastroenterology;;  . SHOULDER ARTHROSCOPY  2008   Left  . TONSILLECTOMY      SOCIAL HISTORY: Social History   Socioeconomic History  .  Marital status: Single    Spouse name: Not on file  . Number of children: 0  . Years of education: Not on file  . Highest education level: Not on file  Occupational History  . 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 Smoking status: Former Smoker    Packs/day: 0.50    Years: 50.00    Pack years: 25.00    Types: Cigarettes    Quit date: 09/19/2018    Years since quitting: 0.7  . Smokeless tobacco:  Never Used  . Tobacco comment: Pt. states that he is down to 1-2 cigs a week  Substance and Sexual Activity  . Alcohol use: Yes    Comment: seldom  . Drug use: No  . Sexual activity: Not on file  Other Topics Concern  . Not on file  Social History Narrative  . Not on file   Social Determinants of Health   Financial Resource Strain:   . Difficulty of Paying Living Expenses: Not on file  Food Insecurity:   . Worried About Charity fundraiser in the Last Year: Not on file  . Ran Out of Food in the Last Year: Not on file  Transportation Needs:   . Lack of Transportation (Medical): Not on file  . Lack of Transportation (Non-Medical): Not on file  Physical Activity:   . Days of Exercise per Week: Not on file  . Minutes of Exercise per Session: Not on file  Stress:   . Feeling of Stress : Not on file  Social Connections:   . Frequency of Communication with Friends and Family: Not on file  . Frequency of Social Gatherings with Friends and Family: Not on file  . Attends Religious Services: Not on file  . Active Member of Clubs or Organizations: Not on file  . Attends Archivist Meetings: Not on file  . Marital Status: Not on file  Intimate Partner Violence:   . Fear of Current or Ex-Partner: Not on file  . Emotionally Abused: Not on file  . Physically Abused: Not on file  . Sexually Abused: Not on file    FAMILY HISTORY: Family History  Problem Relation Age of Onset  . Cancer Mother        type unknown  . Dementia Father   . Prostate cancer Father   . Diabetes Mellitus II Brother   . Colon cancer Neg Hx   . Esophageal cancer Neg Hx   . Inflammatory bowel disease Neg Hx   . Liver disease Neg Hx   . Pancreatic cancer Neg Hx   . Rectal cancer Neg Hx   . Stomach cancer Neg Hx     ALLERGIES:  has No Known Allergies.  MEDICATIONS:  Current Outpatient Medications  Medication Sig Dispense Refill  . albuterol (VENTOLIN HFA) 108 (90 Base) MCG/ACT inhaler INHALE 2  PUFFS INTO THE LUNGS EVERY 4 HOURS AS NEEDED FOR WHEEZE OR SHORTNESS OF BREATH. (Patient taking differently: Inhale 2 puffs into the lungs every 4 (four) hours as needed for wheezing or shortness of breath. ) 18 Inhaler 2  . ALPRAZolam (XANAX) 0.25 MG tablet Take 0.25 mg by mouth 2 (two) times daily as needed for anxiety.    . Ascorbic Acid (VITAMIN C PO) Take 1 tablet by mouth daily.    . Calcium-Magnesium-Zinc (CAL-MAG-ZINC PO) Take 1 tablet by mouth daily.    . Cholecalciferol (VITAMIN D3 PO) Take 1 capsule by mouth daily.    . Cyanocobalamin (B-12 PO)  Place 1 Dose under the tongue daily.    [name] dextromethorphan-guaiFENesin (MUCINEX DM) 30-600 MG 12hr tablet Take 1 tablet by mouth daily as needed for cough.    . diphenhydrAMINE (BENADRYL) 25 mg capsule Take 25-50 mg by mouth every 8 (eight) hours as needed for allergies or sleep.     [name] docusate sodium 100 MG CAPS Take 100 mg by mouth 2 (two) times daily. 60 capsule 1  . Ensure Plus (ENSURE PLUS) LIQD Take 237 mLs by mouth daily.     [name] Ephedrine-Guaifenesin (PRIMATENE ASTHMA) 12.5-200 MG TABS Take 0.25 tablets by mouth daily as needed (for allergies).    . ferrous sulfate 325 (65 FE) MG EC tablet Take 1 tablet (325 mg total) by mouth daily with breakfast. Take with orange juice of vitamin C to help absorption. 90 tablet 1  . ipratropium-albuterol (DUONEB) 0.5-2.5 (3) MG/3ML SOLN USE 3 MLS BY NEBULIZATION 4 (FOUR) TIMES DAILY. (Patient taking differently: Inhale 3 mLs into the lungs every 4 (four) hours as needed (shortness of breath). ) 360 mL 5  . meclizine (ANTIVERT) 25 MG tablet Take 25 mg by mouth 3 (three) times daily as needed for dizziness.    . Na Sulfate-K Sulfate-Mg Sulf (SUPREP BOWEL PREP KIT) 17.5-3.13-1.6 GM/177ML SOLN Take 1 kit by mouth as directed. For colonoscopy prep 354 mL 0  . omeprazole (PRILOSEC) 20 MG capsule Take 1 capsule (20 mg total) by mouth as directed. 20 mg twice daily for 4 weeks then decrease to 20 mg daily. 90 capsule  3  . OXYGEN Inhale 2 L/min into the lungs continuous.    . sodium chloride (OCEAN) 0.65 % nasal spray Place 1 spray into the nose as needed for congestion.    . tamsulosin (FLOMAX) 0.4 MG CAPS capsule Take 0.4 mg by mouth daily.    . Tiotropium Bromide-Olodaterol (STIOLTO RESPIMAT) 2.5-2.5 MCG/ACT AERS Inhale 2 puffs into the lungs daily. (Patient not taking: Reported on 06/11/2019) 2 Inhaler 0  . traMADol (ULTRAM) 50 MG tablet Take 50 mg by mouth 2 (two) times daily.      No current facility-administered medications for this visit.    REVIEW OF SYSTEMS:   Constitutional: ( - ) fevers, ( - )  chills , ( - ) night sweats Eyes: ( - ) blurriness of vision, ( - ) double vision, ( - ) watery eyes Ears, nose, mouth, throat, and face: ( - ) mucositis, ( - ) sore throat Respiratory: ( + ) cough, ( + ) dyspnea, ( + ) wheezes Cardiovascular: ( - ) palpitation, ( - ) chest discomfort, ( - ) lower extremity swelling Gastrointestinal:  ( - ) nausea, ( - ) heartburn, ( - ) change in bowel habits Skin: ( - ) abnormal skin rashes Lymphatics: ( - ) new lymphadenopathy, ( - ) easy bruising Neurological: ( - ) numbness, ( - ) tingling, ( - ) new weaknesses Behavioral/Psych: ( - ) mood change, ( - ) new changes  All other systems were reviewed with the patient and are negative.  PHYSICAL EXAMINATION: ECOG PERFORMANCE STATUS: 3 - Symptomatic, >50% confined to bed  Vitals:   06/25/19 1512  BP: 112/86  Pulse: 91  Resp: 17  Temp: 97.9 F (36.6 C)  SpO2: 96%   Filed Weights   06/25/19 1512  Weight: 136 lb 1.6 oz (61.7 kg)    GENERAL: chronically ill appearing elderly Caucasian male in no distress and comfortable. In a wheelchair SKIN: skin color, texture, turgor  are normal, no rashes or significant lesions EYES: conjunctiva are pink and non-injected, sclera clear LUNGS: clear to auscultation and percussion with normal breathing effort HEART: regular rate & rhythm and no murmurs and no lower  extremity edema Musculoskeletal: no cyanosis of digits and no clubbing  PSYCH: alert & oriented x 3, fluent speech NEURO: no focal motor/sensory deficits  LABORATORY DATA:  I have reviewed the data as listed Recent Results (from the past 2160 hour(s))  Ferritin     Status: Abnormal   Collection Time: 04/24/19  2:26 PM  Result Value Ref Range   Ferritin 15 (L) 24 - 336 ng/mL    Comment: Performed at [facility], [place] [address], [place], [place] [address]  Iron and TIBC     Status: Abnormal   Collection Time: 04/24/19  2:26 PM  Result Value Ref Range   Iron 71 42 - 163 ug/dL   TIBC 369 202 - 409 ug/dL   Saturation Ratios 19 (L) 20 - 55 %   UIBC 298 117 - 376 ug/dL    Comment: Performed at [facility], [place] [address], [place], [place] [address]  CMP ([place] only)     Status: Abnormal   Collection Time: 04/24/19  2:26 PM  Result Value Ref Range   Sodium 140 135 - 145 mmol/L   Potassium 4.4 3.5 - 5.1 mmol/L   Chloride 101 98 - 111 mmol/L   CO2 30 22 - 32 mmol/L   Glucose, Bld 89 70 - 99 mg/dL   BUN 16 8 - 23 mg/dL   Creatinine 1.41 (H) 0.61 - 1.24 mg/dL   Calcium 9.7 8.9 - 10.3 mg/dL   Total Protein 6.9 6.5 - 8.1 g/dL   Albumin 4.1 3.5 - 5.0 g/dL   AST 20 15 - 41 U/L   ALT 15 0 - 44 U/L   Alkaline Phosphatase 81 38 - 126 U/L   Total Bilirubin 0.3 0.3 - 1.2 mg/dL   GFR, Est Non Af Am 47 (L) >60 mL/min   GFR, Est AFR Am 55 (L) >60 mL/min   Anion gap 9 5 - 15    Comment: Performed at [facility], [address] [place],  [address]  CBC with Differential ([place] Only)     Status: Abnormal   Collection Time: 04/24/19  2:26 PM  Result Value Ref Range   WBC Count 6.0 4.0 - 10.5 K/uL   RBC 3.74 (L) 4.22 - 5.81 MIL/uL   Hemoglobin 11.5 (L) 13.0 - 17.0 g/dL   HCT 36.0 (L) 39.0 - 52.0 %   MCV 96.3 80.0 - 100.0 fL   MCH 30.7 26.0 - 34.0 pg   MCHC 31.9 30.0 - 36.0 g/dL   RDW 12.6 11.5  - 15.5 %   Platelet Count 298 150 - 400 K/uL   nRBC 0.0 0.0 - 0.2 %   Neutrophils Relative % 58 %   Neutro Abs 3.5 1.7 - 7.7 K/uL   Lymphocytes Relative 24 %   Lymphs Abs 1.4 0.7 - 4.0 K/uL   Monocytes Relative 10 %   Monocytes Absolute 0.6 0.1 - 1.0 K/uL   Eosinophils Relative 7 %   Eosinophils Absolute 0.4 0.0 - 0.5 K/uL   Basophils Relative 1 %   Basophils Absolute 0.0 0.0 - 0.1 K/uL   Immature Granulocytes 0 %   Abs Immature Granulocytes 0.01 0.00 - 0.07 K/uL    Comment: Performed at  [facility], [place] [address], [place], [place] [address]  Novel Coronavirus, NAA [facility] order, Send-out to Ref Lab; TAT 18-24 hrs     Status: None   Collection Time: 06/15/19  9:52 AM   Specimen: Nasopharyngeal Swab; Respiratory  Result Value Ref Range   SARS-CoV-2, NAA NOT DETECTED NOT DETECTED    Comment: (NOTE) This nucleic acid amplification test was developed and its performance characteristics determined by [employer]. Nucleic acid amplification tests include PCR and TMA. This test has not been FDA cleared or approved. This test has been authorized by FDA under an Emergency Use Authorization (EUA). This test is only authorized for the duration of time the declaration that circumstances exist justifying the authorization of the emergency use of in vitro diagnostic tests for detection of SARS-CoV-2 virus and/or diagnosis of COVID-19 infection under section 564(b)(1) of the Act, 21 U.S.C. 761PJK-9(T) (1), unless the authorization is terminated or revoked sooner. When diagnostic testing is negative, the possibility of a false negative result should be considered in the context of a patient's recent exposures and the presence of clinical signs and symptoms consistent with COVID-19. An individual without symptoms of COVID- 19 and who is not shedding SARS-CoV-2 vi rus would expect to have a negative (not detected) result in this assay. Performed At: [facility] [place], [place] [id number] Rush Farmer MD DX:[id number]    Coronavirus Source NASOPHARYNGEAL     Comment: Performed at [facility], [place] 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 Pylori (cm)     FINAL MICROSCOPIC DIAGNOSIS:  A. DUODENUM, BIOPSY: - Benign small bowel mucosa. - No villous blunting or increase in intraepithelial lymphocytes. - No dysplasia or malignancy.  B. STOMACH, BIOPSY: - Mild chronic gastritis. - Warthin-Starry is negative for Helicobacter pylori. - No intestinal metaplasia, dysplasia, or malignancy.  C. RECTUM, POLYPECTOMY: -  Hyperplastic polyp. -  No dysplasia or malignancy.   GROSS DESCRIPTION:  A: Received in formalin are tan, soft tissue fragments that are submitted in toto. Number: 3 size: 0.3-0.5 cm blocks: 1  B: Received in formalin are tan, soft tissue fragments that are submitted in toto. Number: 4 size: 0.2-0.4 cm blocks: 1  C: Received in formalin is a tan, soft tissue fragment that is submitted in toto.  Size: 1.1 cm, 1 block submitted.  (G RP 06/18/2019)   Final Diagnosis performed by Vicente Males, MD.   Electronically signed 06/19/2019 Technical component performed at [employer]. [facility], [place] [address] [place], [place] [address].  Professional component performed at [facility], [place] [address] [place], [place] [address].  Immunohistochemistry Technical component (if applicable) was performed at [facility]. [address] [place], [place], [place] [address].   IMMUNOHISTOCHEMISTRY DISCLAIMER (if applicable): Some of these immunohistochemical stains may have been  developed and the performance characteristics determine by [facility]. Some may not have been cleared or approved by the U.S. Food and Drug Administration. The FDA has determined that such clearance or approval is not necessary. This test is used for clinical purposes. It should not be regarded as investigational or for research. This laboratory  is certified under the [place] ratory Improvement Amendments of 1988 (CLIA-88) as qualified to perform high complexity clinical laboratory testing.  The controls stained appropriately.   Ferritin     Status: Abnormal   Collection Time: 06/25/19  4:04 PM  Result Value Ref Range   Ferritin 22 (L) 24 - 336 ng/mL    Comment: Performed at [facility], [place] [address], [place], [place] [address]  Iron and TIBC     Status: Abnormal   Collection Time: 06/25/19  4:04 PM  Result Value Ref Range   Iron 57 42 - 163 ug/dL   TIBC 398 202 - 409 ug/dL   Saturation Ratios 14 (L) 20 - 55 %   UIBC 340 117 - 376 ug/dL    Comment: Performed at [facility], [place] [address] [place], [place] [address]  CMP ([place] only)     Status: Abnormal   Collection Time: 06/25/19  4:05 PM  Result Value Ref Range   Sodium 137 135 - 145 mmol/L   Potassium 4.4 3.5 - 5.1 mmol/L   Chloride 100 98 - 111 mmol/L   CO2 29 22 - 32 mmol/L   Glucose, Bld 116 (H) 70 - 99 mg/dL   BUN 21 8 - 23 mg/dL   Creatinine 1.34 (H) 0.61 - 1.24 mg/dL   Calcium 9.1 8.9 - 10.3 mg/dL   Total Protein 6.8 6.5 - 8.1 g/dL   Albumin 3.9 3.5 - 5.0 g/dL   AST 21 15 - 41 U/L   ALT 19 0 - 44 U/L   Alkaline Phosphatase 85 38 - 126 U/L   Total Bilirubin 0.3 0.3 - 1.2 mg/dL   GFR, Est Non Af Am 50 (L) >60 mL/min   GFR, Est AFR Am 58 (L) >60 mL/min   Anion gap 8 5 - 15    Comment: Performed at [facility], [address], [place], [place] [address]  CBC with Differential ([place] Only)     Status:  Abnormal   Collection Time: 06/25/19  4:05 PM  Result Value Ref Range   WBC Count 7.1 4.0 - 10.5 K/uL   RBC 3.81 (L) 4.22 - 5.81 MIL/uL   Hemoglobin 11.6 (L) 13.0 - 17.0 g/dL   HCT 35.9 (L) 39.0 - 52.0 %   MCV 94.2 80.0 - 100.0 fL   MCH 30.4 26.0 - 34.0 pg   MCHC 32.3 30.0 - 36.0 g/dL   RDW 13.0 11.5 - 15.5 %   Platelet Count 272 150 - 400 K/uL   nRBC 0.0 0.0 - 0.2 %   Neutrophils Relative % 68 %   Neutro Abs 4.8 1.7 - 7.7 K/uL   Lymphocytes Relative 17 %   Lymphs Abs 1.2 0.7 - 4.0 K/uL   Monocytes Relative 9 %   Monocytes Absolute 0.6 0.1 - 1.0 K/uL   Eosinophils Relative 5 %   Eosinophils Absolute 0.4 0.0 - 0.5 K/uL   Basophils Relative 1 %   Basophils Absolute 0.1 0.0 - 0.1 K/uL   Immature Granulocytes 0 %   Abs Immature Granulocytes 0.01 0.00 - 0.07 K/uL    Comment: Performed at [facility], [address] [place] [address], [place], [place] [address]    RADIOGRAPHIC STUDIES: No relevant radiographic studies.  ASSESSMENT & PLAN Tyler Horne 79 y.o. male with medical history significant for COPD on home O2, GERD, and postnasal drip who presents for f/u of a normocytic anemia  and iron deficiency.   Labs at his initial visit showed Hgb 11.5, WBC 6.6, Plt 301. Iron 70, TIBC 429, Sat 16% ferritin 16.  These findings are consistent with an iron deficiency anemia.  He was recommended to start on iron 325 mg p.o. daily with a source of vitamin C. given the relative stability of his iron levels and hemoglobin I am concerned that the patient is not necessarily taking his iron as prescribed (his clinic visits are often tangential and difficult to focus, unclear if some component of cognitive decline).  In the event that at his next visit the iron levels have not yet risen we will consider administration of IV iron.  Fortunately in the interim the patient has undergone GI evaluation with endoscopy and colonoscopy.  Colonoscopy was significant for internal hemorrhoids, but no overt  source of bleeding was discovered.  Upper endoscopy revealed no clear sources of bleeding as well.  It was noted that if the patient continues to be iron deficient capsule endoscopy could be considered.  #Normocytic Anemia #Iron Deficiency --labs at last visit (04/24/2019) were consistent with continued iron deficiency anemia, with an iron sat 19% and ferritin of 15. Hgb remains at 11.6 --work up for alternative etiologies of his anemia were negative, included B12, folate, and TSH. May have some component of low Hgb 2/2 to renal dysfunction.  --patient has undergone evaluation with EGD and colonoscopy in Jan 2021 which showed internal hemorrhoids and 3 mm polyp, but no other overt sources of bleeding.  --continue iron pills 377m PO daily with a source of vitamin C. Emphasized the proper way to take these pills today.  --if no improvement of Hgb and iron levels with PO iron, will need to consider starting IV iron at his next visit.  --RTC scheduled in 3 month time to reassess.   All questions were answered. The patient knows to call the clinic with any problems, questions or concerns.  A total of more than 25 minutes were spent face-to-face with the patient during this encounter and over half of that time was spent on counseling and coordination of care as outlined above.   JLedell Peoples MD Department of Hematology/Oncology [facility] LPPhone: [phone]: [phone]: [email or web address]  06/26/2019 10:48 AM

## 2019-06-29 ENCOUNTER — Telehealth: Payer: Self-pay | Admitting: Hematology and Oncology

## 2019-06-29 NOTE — Telephone Encounter (Signed)
Scheduled per los. Called and left msg. mailed printout  

## 2019-07-10 DIAGNOSIS — R69 Illness, unspecified: Secondary | ICD-10-CM | POA: Diagnosis not present

## 2019-07-15 ENCOUNTER — Encounter: Payer: Self-pay | Admitting: Gastroenterology

## 2019-07-17 DIAGNOSIS — I10 Essential (primary) hypertension: Secondary | ICD-10-CM | POA: Diagnosis not present

## 2019-07-24 DIAGNOSIS — J449 Chronic obstructive pulmonary disease, unspecified: Secondary | ICD-10-CM | POA: Diagnosis not present

## 2019-07-24 DIAGNOSIS — R062 Wheezing: Secondary | ICD-10-CM | POA: Diagnosis not present

## 2019-07-29 ENCOUNTER — Ambulatory Visit: Payer: Medicare HMO | Admitting: Gastroenterology

## 2019-08-04 DIAGNOSIS — R69 Illness, unspecified: Secondary | ICD-10-CM | POA: Diagnosis not present

## 2019-08-06 ENCOUNTER — Other Ambulatory Visit (INDEPENDENT_AMBULATORY_CARE_PROVIDER_SITE_OTHER): Payer: Medicare HMO

## 2019-08-06 ENCOUNTER — Ambulatory Visit: Payer: Medicare HMO | Admitting: Gastroenterology

## 2019-08-06 ENCOUNTER — Encounter: Payer: Self-pay | Admitting: Gastroenterology

## 2019-08-06 VITALS — BP 112/62 | HR 82 | Temp 97.7°F | Ht 67.0 in | Wt 136.0 lb

## 2019-08-06 DIAGNOSIS — K2950 Unspecified chronic gastritis without bleeding: Secondary | ICD-10-CM | POA: Diagnosis not present

## 2019-08-06 DIAGNOSIS — D509 Iron deficiency anemia, unspecified: Secondary | ICD-10-CM | POA: Diagnosis not present

## 2019-08-06 LAB — CBC
HCT: 36.9 % — ABNORMAL LOW (ref 39.0–52.0)
Hemoglobin: 12.1 g/dL — ABNORMAL LOW (ref 13.0–17.0)
MCHC: 32.8 g/dL (ref 30.0–36.0)
MCV: 93.3 fl (ref 78.0–100.0)
Platelets: 294 10*3/uL (ref 150.0–400.0)
RBC: 3.96 Mil/uL — ABNORMAL LOW (ref 4.22–5.81)
RDW: 13.3 % (ref 11.5–15.5)
WBC: 6.9 10*3/uL (ref 4.0–10.5)

## 2019-08-06 LAB — IBC + FERRITIN
Ferritin: 20.2 ng/mL — ABNORMAL LOW (ref 22.0–322.0)
Iron: 88 ug/dL (ref 42–165)
Saturation Ratios: 21.1 % (ref 20.0–50.0)
Transferrin: 298 mg/dL (ref 212.0–360.0)

## 2019-08-06 NOTE — Progress Notes (Signed)
[place] VISIT   Primary Care Provider Lorene Dy, MD [address] [place] [address] [phone]  Patient Profile: Tyler Horne is a 79 y.o. male with a pmh significant for COPD (on 2L home O2), HTN, Tobacco Use disorder, GERD, OA, DJD, Anxiety, Allergies.  The patient presents to the [facility] for an evaluation and management of problem(s) noted below:  Problem List 1. Iron deficiency anemia, unspecified iron deficiency anemia type   2. 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  Interval History The patient returns with his brother for scheduled follow-up.  We recently performed an upper and lower endoscopy at the end of January to evaluate iron deficiency.  Other than findings of gastritis on biopsies no evidence of celiac disease or other etiologies for iron deficiency were found.  He remains on daily iron at this time.  He has not undergone repeat blood counts or iron indices.  Patient continues to be on chronic O2 at home.  No significant changes.  No increasing dyspnea on exertion other than his already baseline status.  No new bruising or bleeding.  Patient and brother are interested if we have any ability to help patient get Meals on Wheels.  No significant abdominal pain.  Bowel habits remain unchanged.  No melena or hematochezia has been noted.  GI Review of Systems Positive as above Negative for dysphagia, odynophagia, change in bowel habits   Review of Systems General: Denies fevers/chills/weight loss Cardiovascular: Denies chest pain Pulmonary: Shortness of breath at baseline  Gastroenterological: See HPI Genitourinary: Denies darkened urine or hematuria Hematological: Denies easy bruising/bleeding Dermatological: Denies jaundice Psychological: Mood is stable   Medications Current Outpatient Medications    Medication Sig Dispense Refill  . albuterol (VENTOLIN HFA) 108 (90 Base) MCG/ACT inhaler INHALE 2 PUFFS INTO THE LUNGS EVERY 4 HOURS AS NEEDED FOR WHEEZE OR SHORTNESS OF BREATH. (Patient taking differently: Inhale 2 puffs into the lungs every 4 (four) hours as needed for wheezing or shortness of breath. ) 18 Inhaler 2  . ALPRAZolam (XANAX) 0.25 MG tablet Take 0.25 mg by mouth 2 (two) times daily as needed for anxiety.    . Ascorbic Acid (VITAMIN C PO) Take 1 tablet by mouth daily.    . Calcium-Magnesium-Zinc (CAL-MAG-ZINC PO) Take 1 tablet by mouth daily.    . Cholecalciferol (VITAMIN D3 PO) Take 1 capsule by mouth daily.    . Cyanocobalamin (B-12 PO) Place 1 Dose under the tongue daily.    [name] dextromethorphan-guaiFENesin (MUCINEX DM) 30-600 MG 12hr tablet Take 1 tablet by mouth daily as needed for cough.    . diphenhydrAMINE (BENADRYL) 25 mg capsule Take 25-50 mg by mouth every 8 (eight) hours as needed for allergies or sleep.     [name] docusate sodium 100 MG CAPS Take 100 mg by mouth 2 (two) times daily. 60 capsule 1  . Ensure Plus (ENSURE PLUS) LIQD Take 237 mLs by mouth daily.     [name] Ephedrine-Guaifenesin (PRIMATENE ASTHMA) 12.5-200 MG TABS Take 0.25 tablets by mouth daily as needed (for allergies).    . ferrous sulfate 325 (65 FE) MG EC tablet Take 1 tablet (325 mg total) by mouth daily with breakfast. Take with orange juice of vitamin C to help absorption. 90 tablet 1  . ipratropium-albuterol (DUONEB) 0.5-2.5 (3) MG/3ML SOLN USE 3 MLS BY NEBULIZATION 4 (FOUR) TIMES DAILY. (Patient taking differently: Inhale  3 mLs into the lungs every 4 (four) hours as needed (shortness of breath). ) 360 mL 5  . meclizine (ANTIVERT) 25 MG tablet Take 25 mg by mouth 3 (three) times daily as needed for dizziness.    . Na Sulfate-K Sulfate-Mg Sulf (SUPREP BOWEL PREP KIT) 17.5-3.13-1.6 GM/177ML SOLN Take 1 kit by mouth as directed. For colonoscopy prep 354 mL 0  . omeprazole (PRILOSEC) 20 MG capsule Take 1 capsule (20  mg total) by mouth as directed. 20 mg twice daily for 4 weeks then decrease to 20 mg daily. 90 capsule 3  . OXYGEN Inhale 2 L/min into the lungs continuous.    . sodium chloride (OCEAN) 0.65 % nasal spray Place 1 spray into the nose as needed for congestion.    . tamsulosin (FLOMAX) 0.4 MG CAPS capsule Take 0.4 mg by mouth daily.    . Tiotropium Bromide-Olodaterol (STIOLTO RESPIMAT) 2.5-2.5 MCG/ACT AERS Inhale 2 puffs into the lungs daily. 2 Inhaler 0  . traMADol (ULTRAM) 50 MG tablet Take 50 mg by mouth 2 (two) times daily.      No current facility-administered medications for this visit.    Allergies No Known Allergies  Histories Past Medical History:  Diagnosis Date  . Anemia   . Arthritis    DDD, shoulder  . Asthma    hayfever, seasonal allergies, + smoker, uses primatene pill on occas.   [name] COPD (chronic obstructive pulmonary disease) ([place])   . GERD (gastroesophageal reflux disease)    uses Rolaids on rare occas.   [name] Postnasal drip    uses Benadryl & primatene as needed   . Supplemental oxygen dependent    continuous  . Wears dentures    Past Surgical History:  Procedure Laterality Date  . BIOPSY  06/18/2019   Procedure: BIOPSY;  Surgeon: Rush Landmark Telford Nab., MD;  Location: [place];  Service: Gastroenterology;;  . COLONOSCOPY WITH PROPOFOL N/A 06/18/2019   Procedure: COLONOSCOPY WITH PROPOFOL;  Surgeon: Irving Copas., MD;  Location: [place];  Service: Gastroenterology;  Laterality: N/A;  . ESOPHAGOGASTRODUODENOSCOPY (EGD) WITH PROPOFOL N/A 06/18/2019   Procedure: ESOPHAGOGASTRODUODENOSCOPY (EGD) WITH PROPOFOL;  Surgeon: Rush Landmark Telford Nab., MD;  Location: [place];  Service: Gastroenterology;  Laterality: N/A;  . GANGLION CYST EXCISION Left    L hand   . LUMBAR LAMINECTOMY/DECOMPRESSION MICRODISCECTOMY Left 10/02/2012   Procedure: LUMBAR LAMINECTOMY/DECOMPRESSION MICRODISCECTOMY 2 LEVELS;  Surgeon: Ophelia Charter, MD;  Location: [facility];   Service: Neurosurgery;  Laterality: Left;  Left Lumbar four-five Lumbar five sacral one diskectomy  . PILONIDAL CYST EXCISION    . POLYPECTOMY  06/18/2019   Procedure: POLYPECTOMY;  Surgeon: Mansouraty, Telford Nab., MD;  Location: [facility];  Service: Gastroenterology;;  . SHOULDER ARTHROSCOPY  2008   Left  . TONSILLECTOMY     Social History   Socioeconomic History  . Marital status: Single    Spouse name: Not on file  . Number of children: 0  . Years of education: Not on file  . Highest education level: Not on file  Occupational History  . 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 Smoking status: Former Smoker    Packs/day: 0.50    Years: 50.00    Pack years: 25.00    Types: Cigarettes    Quit date: 09/19/2018    Years since quitting: 0.8  . Smokeless tobacco: Never Used  . Tobacco comment: Pt. states that he is down to 1-2 cigs a week  Substance and Sexual Activity  .  Alcohol use: Yes    Comment: seldom  . Drug use: No  . Sexual activity: Not on file  Other Topics Concern  . Not on file  Social History Narrative  . Not on file   Social Determinants of Health   Financial Resource Strain:   . Difficulty of Paying Living Expenses: Not on file  Food Insecurity:   . Worried About Charity fundraiser in the Last Year: Not on file  . Ran Out of Food in the Last Year: Not on file  Transportation Needs:   . Lack of Transportation (Medical): Not on file  . Lack of Transportation (Non-Medical): Not on file  Physical Activity:   . Days of Exercise per Week: Not on file  . Minutes of Exercise per Session: Not on file  Stress:   . Feeling of Stress : Not on file  Social Connections:   . Frequency of Communication with Friends and Family: Not on file  . Frequency of Social Gatherings with Friends and Family: Not on file  . Attends Religious Services: Not on file  . Active Member of Clubs or Organizations: Not on file  . Attends Archivist Meetings:  Not on file  . Marital Status: Not on file  Intimate Partner Violence:   . Fear of Current or Ex-Partner: Not on file  . Emotionally Abused: Not on file  . Physically Abused: Not on file  . Sexually Abused: Not on file   Family History  Problem Relation Age of Onset  . Cancer Mother        type unknown  . Dementia Father   . Prostate cancer Father   . Diabetes Mellitus II Brother   . Colon cancer Neg Hx   . Esophageal cancer Neg Hx   . Inflammatory bowel disease Neg Hx   . Liver disease Neg Hx   . Pancreatic cancer Neg Hx   . Rectal cancer Neg Hx   . Stomach cancer Neg Hx    I have reviewed his medical, social, and family history in detail and updated the electronic medical record as necessary.    PHYSICAL EXAMINATION  BP 112/62   Pulse 82   Temp 97.7 F (36.5 C)   Ht 5' 7"  (1.702 m)   Wt 136 lb (61.7 kg)   BMI 21.30 kg/m  Wt Readings from Last 3 Encounters:  08/06/19 136 lb (61.7 kg)  06/25/19 136 lb 1.6 oz (61.7 kg)  06/18/19 136 lb (61.7 kg)  GEN: NAD, appears stated age, appears chronically ill but nontoxic, accompanied by brother PSYCH: Cooperative EYE: Conjunctivae pink, sclerae anicteric ENT: Nasal cannula in place for O2, MMM, without oral ulcers CV: Nontachycardic RESP: Wheezing appreciated throughout the lung exam GI: NABS, soft, NT/ND, without rebound or guarding, no HSM appreciated MSK/EXT: Bilateral lower extremity edema present SKIN: No jaundice NEURO:  Alert & Oriented x 3, no focal deficits, in wheelchair   REVIEW OF DATA  I reviewed the following data at the time of this encounter:  GI Procedures and Studies  January 2021 EGD - No gross lesions in esophagus. - Z-line regular, 43 cm from the incisors. - Erythematous mucosa in the gastric body. Biopsied. - No gross lesions in the stomach. - No gross lesions in the duodenal bulb, in the first portion of the duodenum and in the second portion of the duodenum. Biopsied.  January 2021  colonoscopy - Hemorrhoids found on digital rectal exam. - The examined portion of the ileum  was normal. - Stool in the entire examined colon. Lavaged with adequate preparation. - There was significant looping of the colon. - One 3 mm polyp in the rectum, removed with a cold snare. Resected and retrieved. - Normal mucosa in the entire examined colon otherwise. - Non-bleeding non-thrombosed internal hemorrhoids.  Laboratory Studies  Reviewed that in EPIC  Imaging Studies  No new imaging studies to review   ASSESSMENT  Mr. Kendrix is a 79 y.o. male with a pmh significant for COPD (on 2L home O2), HTN, Tobacco Use disorder, GERD, OA, DJD, Anxiety, Allergies.  The patient is seen today for evaluation and management of:  1. Iron deficiency anemia, unspecified iron deficiency anemia type   2. 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  We will obtain laboratories to evaluate for whether the patient may have persistent iron deficiency.  If he does, we have discussed that we have 2 options.  Option 1, is to proceed with IV iron infusions and proceed with video capsule endoscopy.  Option 2, is to proceed with iron infusions and then consider video capsule endoscopy down the road.  The patient and brother would like to try IV iron infusion first if that was felt to be indicated before a video capsule endoscopy.  We discussed that a video capsule endoscopy is a very low risk procedure overall although pill capsule retention can occur at times.  This very rarely will lead to need for endoscopic or surgical removal.  We would be looking for angioectasias or other etiologies of findings for potential chronic blood loss anemia.  They do understand that this would require a deeper type of sedation for a deep enteroscopy-device assisted if necessary.  They would like to hold on that if at all possible but understand the potential need.  We will see the labs  and go from there.  All patient questions were answered, to the best of my ability, and the patient agrees to the aforementioned plan of action with follow-up as indicated.   PLAN  Laboratories as outlined below Continue oral iron daily Based on labs will consider IV iron Based on labs will consider video capsule endoscopy   Orders Placed This Encounter  Procedures  . CBC  . IBC + Ferritin    New Prescriptions   No medications on file   Modified Medications   No medications on file    Planned Follow Up No follow-ups on file.   Total Time in Face-to-Face and in Coordination of Care for patient including independent/personal interpretation/review of prior testing, medical history, examination, medication adjustment, communicating results with the patient directly, and documentation with the EHR is 30 minutes.   Justice Britain, MD [facility] Office # [phone]

## 2019-08-06 NOTE — Patient Instructions (Signed)
Your provider has requested that you go to the basement level for lab work before leaving today. Press "B" on the elevator. The lab is located at the first door on the left as you exit the elevator.   Due to recent changes in healthcare laws, you may see the results of your imaging and laboratory studies on MyChart before your provider has had a chance to review them.  We understand that in some cases there may be results that are confusing or concerning to you. Not all laboratory results come back in the same time frame and the provider may be waiting for multiple results in order to interpret others.  Please give us 48 hours in order for your provider to thoroughly review all the results before contacting the office for clarification of your results.    Thank you for choosing me and [facility].  Dr. Mansouraty  

## 2019-08-06 NOTE — Progress Notes (Signed)
IDA

## 2019-08-08 ENCOUNTER — Encounter: Payer: Self-pay | Admitting: Gastroenterology

## 2019-08-08 DIAGNOSIS — K2970 Gastritis, unspecified, without bleeding: Secondary | ICD-10-CM | POA: Insufficient documentation

## 2019-08-15 DIAGNOSIS — I10 Essential (primary) hypertension: Secondary | ICD-10-CM | POA: Diagnosis not present

## 2019-08-21 DIAGNOSIS — J449 Chronic obstructive pulmonary disease, unspecified: Secondary | ICD-10-CM | POA: Diagnosis not present

## 2019-08-21 DIAGNOSIS — R062 Wheezing: Secondary | ICD-10-CM | POA: Diagnosis not present

## 2019-09-01 DIAGNOSIS — R69 Illness, unspecified: Secondary | ICD-10-CM | POA: Diagnosis not present

## 2019-09-17 ENCOUNTER — Other Ambulatory Visit: Payer: Self-pay | Admitting: Hematology and Oncology

## 2019-09-21 DIAGNOSIS — R062 Wheezing: Secondary | ICD-10-CM | POA: Diagnosis not present

## 2019-09-21 DIAGNOSIS — R2231 Localized swelling, mass and lump, right upper limb: Secondary | ICD-10-CM | POA: Diagnosis not present

## 2019-09-21 DIAGNOSIS — J449 Chronic obstructive pulmonary disease, unspecified: Secondary | ICD-10-CM | POA: Diagnosis not present

## 2019-09-21 DIAGNOSIS — M19041 Primary osteoarthritis, right hand: Secondary | ICD-10-CM | POA: Diagnosis not present

## 2019-09-22 ENCOUNTER — Other Ambulatory Visit: Payer: Self-pay | Admitting: Orthopedic Surgery

## 2019-09-22 ENCOUNTER — Other Ambulatory Visit: Payer: Self-pay | Admitting: Hematology and Oncology

## 2019-09-22 DIAGNOSIS — R2231 Localized swelling, mass and lump, right upper limb: Secondary | ICD-10-CM

## 2019-09-22 DIAGNOSIS — D500 Iron deficiency anemia secondary to blood loss (chronic): Secondary | ICD-10-CM

## 2019-09-22 NOTE — Progress Notes (Signed)
[place] [phone]   [phone]  PROGRESS NOTE  Patient Care Team: Lorene Dy, MD as PCP - General (Internal Medicine)  Hematological/Oncological History #Normocytic Anemia #Iron Deficiency Anemia 1) Establish care with Dr. Lorenso Courier on 03/27/19 due to Hgb drop 14-12.2 per outside records 2) 03/27/2019: Hgb 11.5, WBC 6.6, Plt 301. Iron 70, TIBC 429, Sat 16% ferritin 16 3) 04/24/2019: Hgb 11.5, WBC 6.0, Plt 298. Iron 71, TIBC 369, Sat 19%, ferritin 15 4) 06/18/2019: EGD and colonoscopy performed. Revealed internal hemorrhoids and 3 mm polyp as well as dark stool throughout the colon.  5)  06/25/2019: Hgb 11.6, WBC 7.1, Plt 272 . Iron 57, TIBC 398, Sat 14%, Ferritin 22  Interval History:  Pancho Rushing 79 y.o. male with medical history significant for iron deficiency anemia presents for a follow up visit. He was last seen on 06/24/2018. In the interim since his last visit he has had no hospitalizations or ED visits. .  On exam today Mr. Kujawa is accompanied by his brother.  He reports that he had been taking the iron as prescribed without any constipation or abdominal pain.  He reports he has had no changes in his energy level and that his breathing has been okay.  He continues to use supplemental oxygen.  He continues to deny any overt signs of bleeding such as nosebleeds, bruising, or dark stools.  Today he is unfocused in his conversation but does answer appropriately when directly asked questions.  He denies having any new symptoms today.  A full 10 point ROS is listed below.  MEDICAL HISTORY:  Past Medical History:  Diagnosis Date  . Anemia   . Arthritis    DDD, shoulder  . Asthma    hayfever, seasonal allergies, + smoker, uses primatene pill on occas.   [name] COPD (chronic obstructive pulmonary disease) ([place])   . GERD (gastroesophageal reflux disease)    uses Rolaids on rare occas.   [name] Postnasal drip    uses Benadryl & primatene as needed   .  Supplemental oxygen dependent    continuous  . Wears dentures     SURGICAL HISTORY: Past Surgical History:  Procedure Laterality Date  . BIOPSY  06/18/2019   Procedure: BIOPSY;  Surgeon: Rush Landmark Telford Nab., MD;  Location: [place];  Service: Gastroenterology;;  . COLONOSCOPY WITH PROPOFOL N/A 06/18/2019   Procedure: COLONOSCOPY WITH PROPOFOL;  Surgeon: Irving Copas., MD;  Location: [place];  Service: Gastroenterology;  Laterality: N/A;  . ESOPHAGOGASTRODUODENOSCOPY (EGD) WITH PROPOFOL N/A 06/18/2019   Procedure: ESOPHAGOGASTRODUODENOSCOPY (EGD) WITH PROPOFOL;  Surgeon: Rush Landmark Telford Nab., MD;  Location: [place];  Service: Gastroenterology;  Laterality: N/A;  . GANGLION CYST EXCISION Left    L hand   . LUMBAR LAMINECTOMY/DECOMPRESSION MICRODISCECTOMY Left 10/02/2012   Procedure: LUMBAR LAMINECTOMY/DECOMPRESSION MICRODISCECTOMY 2 LEVELS;  Surgeon: Ophelia Charter, MD;  Location: [place] [facility];  Service: Neurosurgery;  Laterality: Left;  Left Lumbar four-five Lumbar five sacral one diskectomy  . PILONIDAL CYST EXCISION    . POLYPECTOMY  06/18/2019   Procedure: POLYPECTOMY;  Surgeon: Mansouraty, Telford Nab., MD;  [facility];  Service: Gastroenterology;;  . SHOULDER ARTHROSCOPY  2008   Left  . TONSILLECTOMY      ALLERGIES:  has No Known Allergies.  MEDICATIONS:  Current Outpatient Medications  Medication Sig Dispense Refill  . albuterol (VENTOLIN HFA) 108 (90 Base) MCG/ACT inhaler INHALE 2 PUFFS INTO THE LUNGS EVERY 4 HOURS AS NEEDED FOR WHEEZE OR SHORTNESS OF  BREATH. (Patient taking differently: Inhale 2 puffs into the lungs every 4 (four) hours as needed for wheezing or shortness of breath. ) 18 Inhaler 2  . ALPRAZolam (XANAX) 0.25 MG tablet Take 0.25 mg by mouth 2 (two) times daily as needed for anxiety.    . Ascorbic Acid (VITAMIN C PO) Take 1 tablet by mouth daily.    . Calcium-Magnesium-Zinc (CAL-MAG-ZINC PO) Take 1 tablet by mouth daily.      . Cholecalciferol (VITAMIN D3 PO) Take 1 capsule by mouth daily.    . Cyanocobalamin (B-12 PO) Place 1 Dose under the tongue daily.    [name] dextromethorphan-guaiFENesin (MUCINEX DM) 30-600 MG 12hr tablet Take 1 tablet by mouth daily as needed for cough.    . diphenhydrAMINE (BENADRYL) 25 mg capsule Take 25-50 mg by mouth every 8 (eight) hours as needed for allergies or sleep.     [name] docusate sodium 100 MG CAPS Take 100 mg by mouth 2 (two) times daily. 60 capsule 1  . Ensure Plus (ENSURE PLUS) LIQD Take 237 mLs by mouth daily.     [name] Ephedrine-Guaifenesin (PRIMATENE ASTHMA) 12.5-200 MG TABS Take 0.25 tablets by mouth daily as needed (for allergies).    . ferrous sulfate 325 (65 FE) MG EC tablet TAKE 1 TABLET BY MOUTH DAILY WITH BREAKFAST. TAKE WITH ORANGE JUICE OF VITAMIN C TO HELP ABSORPTION. 90 tablet 1  . ipratropium-albuterol (DUONEB) 0.5-2.5 (3) MG/3ML SOLN USE 3 MLS BY NEBULIZATION 4 (FOUR) TIMES DAILY. (Patient taking differently: Inhale 3 mLs into the lungs every 4 (four) hours as needed (shortness of breath). ) 360 mL 5  . meclizine (ANTIVERT) 25 MG tablet Take 25 mg by mouth 3 (three) times daily as needed for dizziness.    . Na Sulfate-K Sulfate-Mg Sulf (SUPREP BOWEL PREP KIT) 17.5-3.13-1.6 GM/177ML SOLN Take 1 kit by mouth as directed. For colonoscopy prep 354 mL 0  . omeprazole (PRILOSEC) 20 MG capsule Take 1 capsule (20 mg total) by mouth as directed. 20 mg twice daily for 4 weeks then decrease to 20 mg daily. 90 capsule 3  . OXYGEN Inhale 2 L/min into the lungs continuous.    . sodium chloride (OCEAN) 0.65 % nasal spray Place 1 spray into the nose as needed for congestion.    . tamsulosin (FLOMAX) 0.4 MG CAPS capsule Take 0.4 mg by mouth daily.    . Tiotropium Bromide-Olodaterol (STIOLTO RESPIMAT) 2.5-2.5 MCG/ACT AERS Inhale 2 puffs into the lungs daily. 2 Inhaler 0  . traMADol (ULTRAM) 50 MG tablet Take 50 mg by mouth 2 (two) times daily.      No current facility-administered  medications for this visit.    REVIEW OF SYSTEMS:   Constitutional: ( - ) fevers, ( - )  chills , ( - ) night sweats Eyes: ( - ) blurriness of vision, ( - ) double vision, ( - ) watery eyes Ears, nose, mouth, throat, and face: ( - ) mucositis, ( - ) sore throat Respiratory: ( + ) cough, ( + ) dyspnea, ( + ) wheezes Cardiovascular: ( - ) palpitation, ( - ) chest discomfort, ( - ) lower extremity swelling Gastrointestinal:  ( - ) nausea, ( - ) heartburn, ( - ) change in bowel habits Skin: ( - ) abnormal skin rashes Lymphatics: ( - ) new lymphadenopathy, ( - ) easy bruising Neurological: ( - ) numbness, ( - ) tingling, ( - ) new weaknesses Behavioral/Psych: ( - ) mood change, ( - ) new changes  All other systems were reviewed with the patient and are negative.  PHYSICAL EXAMINATION: ECOG PERFORMANCE STATUS: 3 - Symptomatic, >50% confined to bed  There were no vitals filed for this visit. There were no vitals filed for this visit.  GENERAL: chronically ill appearing elderly Caucasian male in no distress and comfortable. In a wheelchair SKIN: skin color, texture, turgor are normal, no rashes or significant lesions EYES: conjunctiva are pink and non-injected, sclera clear LUNGS: clear to auscultation and percussion with normal breathing effort HEART: regular rate & rhythm and no murmurs and no lower extremity edema Musculoskeletal: no cyanosis of digits and no clubbing  PSYCH: alert & oriented x 3, fluent speech NEURO: no focal motor/sensory deficits  LABORATORY DATA:  I have reviewed the data as listed Recent Results (from the past 2160 hour(s))  Ferritin     Status: Abnormal   Collection Time: 06/25/19  4:04 PM  Result Value Ref Range   Ferritin 22 (L) 24 - 336 ng/mL    Comment: Performed at [facility], [place] [address] [place], [place] [address]  Iron and TIBC     Status: Abnormal   Collection Time: 06/25/19  4:04 PM  Result Value Ref Range   Iron 57  42 - 163 ug/dL   TIBC 398 202 - 409 ug/dL   Saturation Ratios 14 (L) 20 - 55 %   UIBC 340 117 - 376 ug/dL    Comment: Performed at [facility], [place] [address] [place], [place] [address]  CMP ([place] only)     Status: Abnormal   Collection Time: 06/25/19  4:05 PM  Result Value Ref Range   Sodium 137 135 - 145 mmol/L   Potassium 4.4 3.5 - 5.1 mmol/L   Chloride 100 98 - 111 mmol/L   CO2 29 22 - 32 mmol/L   Glucose, Bld 116 (H) 70 - 99 mg/dL   BUN 21 8 - 23 mg/dL   Creatinine 1.34 (H) 0.61 - 1.24 mg/dL   Calcium 9.1 8.9 - 10.3 mg/dL   Total Protein 6.8 6.5 - 8.1 g/dL   Albumin 3.9 3.5 - 5.0 g/dL   AST 21 15 - 41 U/L   ALT 19 0 - 44 U/L   Alkaline Phosphatase 85 38 - 126 U/L   Total Bilirubin 0.3 0.3 - 1.2 mg/dL   GFR, Est Non Af Am 50 (L) >60 mL/min   GFR, Est AFR Am 58 (L) >60 mL/min   Anion gap 8 5 - 15    Comment: Performed at [facility], [address] [place], [place] [address]  CBC with Differential ([place] Only)     Status: Abnormal   Collection Time: 06/25/19  4:05 PM  Result Value Ref Range   WBC Count 7.1 4.0 - 10.5 K/uL   RBC 3.81 (L) 4.22 - 5.81 MIL/uL   Hemoglobin 11.6 (L) 13.0 - 17.0 g/dL   HCT 35.9 (L) 39.0 - 52.0 %   MCV 94.2 80.0 - 100.0 fL   MCH 30.4 26.0 - 34.0 pg   MCHC 32.3 30.0 - 36.0 g/dL   RDW 13.0 11.5 - 15.5 %   Platelet Count 272 150 - 400 K/uL   nRBC 0.0 0.0 - 0.2 %   Neutrophils Relative % 68 %   Neutro Abs 4.8 1.7 - 7.7 K/uL   Lymphocytes Relative 17 %   Lymphs Abs 1.2 0.7 - 4.0 K/uL   Monocytes Relative 9 %  Monocytes Absolute 0.6 0.1 - 1.0 K/uL   Eosinophils Relative 5 %   Eosinophils Absolute 0.4 0.0 - 0.5 K/uL   Basophils Relative 1 %   Basophils Absolute 0.1 0.0 - 0.1 K/uL   Immature Granulocytes 0 %   Abs Immature Granulocytes 0.01 0.00 - 0.07 K/uL    Comment: Performed at [facility], [place] [address], [place], [place] [address]  IBC +  Ferritin     Status: Abnormal   Collection Time: 08/06/19 12:21 PM  Result Value Ref Range   Iron 88 42 - 165 ug/dL   Transferrin 298.0 212.0 - 360.0 mg/dL   Saturation Ratios 21.1 20.0 - 50.0 %   Ferritin 20.2 (L) 22.0 - 322.0 ng/mL  CBC     Status: Abnormal   Collection Time: 08/06/19 12:21 PM  Result Value Ref Range   WBC 6.9 4.0 - 10.5 K/uL   RBC 3.96 (L) 4.22 - 5.81 Mil/uL   Platelets 294.0 150.0 - 400.0 K/uL   Hemoglobin 12.1 (L) 13.0 - 17.0 g/dL   HCT 36.9 (L) 39.0 - 52.0 %   MCV 93.3 78.0 - 100.0 fl   MCHC 32.8 30.0 - 36.0 g/dL   RDW 13.3 11.5 - 15.5 %    RADIOGRAPHIC STUDIES: No relevant radiographic studies.  ASSESSMENT & PLAN Abdulwahab Demelo 79 y.o. male with medical history significant for COPD on home O2, GERD, and postnasal drip who presents for f/u of a normocytic anemia and iron deficiency.   Labs at his last visit showed Hgb 11.6, WBC 7.1, Plt 272. Iron 57, TIBC 398, Sat 14% ferritin 22.  These findings are consistent with continued iron deficiency anemia.  On his initial visit he was recommended to start on iron 325 mg p.o. daily with a source of vitamin C. given the relative stability of his iron levels and hemoglobin I am concerned that the patient is not necessarily taking his iron as prescribed (his clinic visits are often tangential and difficult to focus, unclear if some component of cognitive decline).  Given the failure for his iron levels to rise appropriately while on p.o. therapy I would recommend that we proceed with IV iron.  Fortunately the patient has undergone GI evaluation with endoscopy and colonoscopy.  Colonoscopy was significant for internal hemorrhoids, but no overt source of bleeding was discovered.  Upper endoscopy revealed no clear sources of bleeding as well.  It was noted that if the patient continues to be iron deficient capsule endoscopy could be considered.  #Normocytic Anemia #Iron Deficiency Refractory to PO Iron Therapy  --labs at last  visit (06/24/2018) were consistent with continued iron deficiency anemia and unfortunately after 3 months of continued therapy these remain low.  --work up for alternative etiologies of his anemia were negative, included B12, folate, and TSH. May have some component of low Hgb 2/2 to renal dysfunction.  --patient has undergone evaluation with EGD and colonoscopy in Jan 2021 which showed internal hemorrhoids and 3 mm polyp, but no other overt sources of bleeding.  --continue iron pills 371m PO daily with a source of vitamin C. Re-emphasized the proper way to take these pills today.  --plan to start Feraheme [id number] days x 2 doses, plan to start in the next 1-2 weeks.  --RTC scheduled 3 months after last dose of iron.    All questions were answered. The patient knows to call the clinic with any problems, questions or concerns.  A total of more than 20 minutes  were spent face-to-face with the patient during this encounter and over half of that time was spent on counseling and coordination of care as outlined above.   Ledell Peoples, MD Department of Hematology/Oncology [place] [facility] Phone: [phone] Pager: [phone] Email: [email or web address]  09/22/2019 3:23 PM

## 2019-09-23 ENCOUNTER — Other Ambulatory Visit: Payer: Self-pay

## 2019-09-23 ENCOUNTER — Inpatient Hospital Stay: Payer: Medicare HMO

## 2019-09-23 ENCOUNTER — Inpatient Hospital Stay: Payer: Medicare HMO | Attending: Hematology and Oncology | Admitting: Hematology and Oncology

## 2019-09-23 VITALS — BP 119/81 | HR 82 | Resp 17 | Ht 67.0 in | Wt 134.2 lb

## 2019-09-23 DIAGNOSIS — N289 Disorder of kidney and ureter, unspecified: Secondary | ICD-10-CM | POA: Diagnosis not present

## 2019-09-23 DIAGNOSIS — D509 Iron deficiency anemia, unspecified: Secondary | ICD-10-CM | POA: Diagnosis present

## 2019-09-23 DIAGNOSIS — Z9981 Dependence on supplemental oxygen: Secondary | ICD-10-CM | POA: Insufficient documentation

## 2019-09-23 DIAGNOSIS — D649 Anemia, unspecified: Secondary | ICD-10-CM | POA: Diagnosis not present

## 2019-09-23 DIAGNOSIS — J449 Chronic obstructive pulmonary disease, unspecified: Secondary | ICD-10-CM | POA: Diagnosis not present

## 2019-09-23 DIAGNOSIS — D500 Iron deficiency anemia secondary to blood loss (chronic): Secondary | ICD-10-CM | POA: Diagnosis not present

## 2019-09-23 DIAGNOSIS — K219 Gastro-esophageal reflux disease without esophagitis: Secondary | ICD-10-CM | POA: Diagnosis not present

## 2019-09-23 LAB — CMP (CANCER CENTER ONLY)
ALT: 14 U/L (ref 0–44)
AST: 23 U/L (ref 15–41)
Albumin: 4.1 g/dL (ref 3.5–5.0)
Alkaline Phosphatase: 73 U/L (ref 38–126)
Anion gap: 7 (ref 5–15)
BUN: 20 mg/dL (ref 8–23)
CO2: 31 mmol/L (ref 22–32)
Calcium: 9.3 mg/dL (ref 8.9–10.3)
Chloride: 100 mmol/L (ref 98–111)
Creatinine: 1.67 mg/dL — ABNORMAL HIGH (ref 0.61–1.24)
GFR, Est AFR Am: 44 mL/min — ABNORMAL LOW (ref >60)
GFR, Estimated: 38 mL/min — ABNORMAL LOW (ref >60)
Glucose, Bld: 97 mg/dL (ref 70–99)
Potassium: 4.8 mmol/L (ref 3.5–5.1)
Sodium: 138 mmol/L (ref 135–145)
Total Bilirubin: 0.4 mg/dL (ref 0.3–1.2)
Total Protein: 7.2 g/dL (ref 6.5–8.1)

## 2019-09-23 LAB — RETIC PANEL
Immature Retic Fract: 7.2 % (ref 2.3–15.9)
RBC.: 4.15 MIL/uL — ABNORMAL LOW (ref 4.22–5.81)
Retic Count, Absolute: 39.8 10*3/uL (ref 19.0–186.0)
Retic Ct Pct: 1 % (ref 0.4–3.1)
Reticulocyte Hemoglobin: 35.6 pg (ref >27.9)

## 2019-09-23 LAB — CBC WITH DIFFERENTIAL (CANCER CENTER ONLY)
Abs Immature Granulocytes: 0.01 10*3/uL (ref 0.00–0.07)
Basophils Absolute: 0.1 10*3/uL (ref 0.0–0.1)
Basophils Relative: 1 %
Eosinophils Absolute: 0.3 10*3/uL (ref 0.0–0.5)
Eosinophils Relative: 5 %
HCT: 39.5 % (ref 39.0–52.0)
Hemoglobin: 12.7 g/dL — ABNORMAL LOW (ref 13.0–17.0)
Immature Granulocytes: 0 %
Lymphocytes Relative: 24 %
Lymphs Abs: 1.6 10*3/uL (ref 0.7–4.0)
MCH: 30.7 pg (ref 26.0–34.0)
MCHC: 32.2 g/dL (ref 30.0–36.0)
MCV: 95.4 fL (ref 80.0–100.0)
Monocytes Absolute: 0.6 10*3/uL (ref 0.1–1.0)
Monocytes Relative: 8 %
Neutro Abs: 4.1 10*3/uL (ref 1.7–7.7)
Neutrophils Relative %: 62 %
Platelet Count: 288 10*3/uL (ref 150–400)
RBC: 4.14 MIL/uL — ABNORMAL LOW (ref 4.22–5.81)
RDW: 12.6 % (ref 11.5–15.5)
WBC Count: 6.6 10*3/uL (ref 4.0–10.5)
nRBC: 0 % (ref 0.0–0.2)

## 2019-09-24 LAB — IRON AND TIBC
Iron: 81 ug/dL (ref 42–163)
Saturation Ratios: 20 % (ref 20–55)
TIBC: 413 ug/dL — ABNORMAL HIGH (ref 202–409)
UIBC: 332 ug/dL (ref 117–376)

## 2019-09-24 LAB — FERRITIN: Ferritin: 26 ng/mL (ref 24–336)

## 2019-09-25 ENCOUNTER — Other Ambulatory Visit: Payer: Medicare HMO

## 2019-09-27 ENCOUNTER — Encounter: Payer: Self-pay | Admitting: Hematology and Oncology

## 2019-09-27 DIAGNOSIS — D500 Iron deficiency anemia secondary to blood loss (chronic): Secondary | ICD-10-CM | POA: Insufficient documentation

## 2019-10-01 DIAGNOSIS — R69 Illness, unspecified: Secondary | ICD-10-CM | POA: Diagnosis not present

## 2019-10-07 DIAGNOSIS — J449 Chronic obstructive pulmonary disease, unspecified: Secondary | ICD-10-CM | POA: Diagnosis not present

## 2019-10-08 DIAGNOSIS — J449 Chronic obstructive pulmonary disease, unspecified: Secondary | ICD-10-CM | POA: Diagnosis not present

## 2019-10-09 DIAGNOSIS — J449 Chronic obstructive pulmonary disease, unspecified: Secondary | ICD-10-CM | POA: Diagnosis not present

## 2019-10-15 ENCOUNTER — Telehealth: Payer: Self-pay | Admitting: *Deleted

## 2019-10-15 DIAGNOSIS — J449 Chronic obstructive pulmonary disease, unspecified: Secondary | ICD-10-CM | POA: Diagnosis not present

## 2019-10-15 NOTE — Telephone Encounter (Signed)
Received vm message from pt's brother, Tyler Horne inquiring about appointments for pt's Fereheme infusion as they have not been scheduled yet.  High priority scheduling message sent to [facility], Dr. Libby Maw scheduler.

## 2019-10-16 DIAGNOSIS — J449 Chronic obstructive pulmonary disease, unspecified: Secondary | ICD-10-CM | POA: Diagnosis not present

## 2019-10-21 DIAGNOSIS — J449 Chronic obstructive pulmonary disease, unspecified: Secondary | ICD-10-CM | POA: Diagnosis not present

## 2019-10-21 DIAGNOSIS — R062 Wheezing: Secondary | ICD-10-CM | POA: Diagnosis not present

## 2019-10-26 NOTE — Progress Notes (Signed)
Intravenous Iron Formulation Change  Mr. Schnitker has insurance that requires a change in intravenous iron product from Silvana to a preferred product, including Venofer. Orders have been updated to reflect this change and scheduling message sent to adjust infusion appointments. Dr. Lorenso Courier notified. NKDA.  The plan for iron therapy is as follows: Venofer 200mg  IV twice weekly x5 doses.   Demetrius Charity, PharmD, BCPS, [facility] Phone: [phone] 10/26/2019

## 2019-10-30 ENCOUNTER — Ambulatory Visit: Payer: Medicare HMO

## 2019-10-30 ENCOUNTER — Inpatient Hospital Stay: Payer: Medicare HMO | Attending: Hematology and Oncology

## 2019-10-30 ENCOUNTER — Other Ambulatory Visit: Payer: Self-pay

## 2019-10-30 VITALS — BP 110/68 | HR 82 | Temp 97.6°F | Resp 18

## 2019-10-30 DIAGNOSIS — D509 Iron deficiency anemia, unspecified: Secondary | ICD-10-CM | POA: Diagnosis present

## 2019-10-30 DIAGNOSIS — D500 Iron deficiency anemia secondary to blood loss (chronic): Secondary | ICD-10-CM

## 2019-10-30 DIAGNOSIS — R69 Illness, unspecified: Secondary | ICD-10-CM | POA: Diagnosis not present

## 2019-10-30 MED ORDER — SODIUM CHLORIDE 0.9 % IV SOLN
Freq: Once | INTRAVENOUS | Status: AC
  Filled 2019-10-30: qty 250

## 2019-10-30 MED ORDER — SODIUM CHLORIDE 0.9 % IV SOLN
200.0000 mg | Freq: Once | INTRAVENOUS | Status: AC
  Administered 2019-10-30: 200 mg via INTRAVENOUS
  Filled 2019-10-30: qty 200

## 2019-11-04 ENCOUNTER — Ambulatory Visit
Admission: RE | Admit: 2019-11-04 | Discharge: 2019-11-04 | Disposition: A | Discharge status: Home / Self Care | Payer: Medicare HMO | Source: Ambulatory Visit | Attending: Orthopedic Surgery | Admitting: Orthopedic Surgery

## 2019-11-04 ENCOUNTER — Other Ambulatory Visit: Payer: Self-pay

## 2019-11-04 ENCOUNTER — Inpatient Hospital Stay: Payer: Medicare HMO | Attending: Hematology and Oncology

## 2019-11-04 VITALS — BP 138/76 | HR 75 | Temp 97.7°F | Resp 18

## 2019-11-04 DIAGNOSIS — K648 Other hemorrhoids: Secondary | ICD-10-CM | POA: Insufficient documentation

## 2019-11-04 DIAGNOSIS — R2231 Localized swelling, mass and lump, right upper limb: Secondary | ICD-10-CM | POA: Diagnosis not present

## 2019-11-04 DIAGNOSIS — Z86010 Personal history of colonic polyps: Secondary | ICD-10-CM | POA: Insufficient documentation

## 2019-11-04 DIAGNOSIS — D509 Iron deficiency anemia, unspecified: Secondary | ICD-10-CM | POA: Insufficient documentation

## 2019-11-04 DIAGNOSIS — Z9119 Patient's noncompliance with other medical treatment and regimen: Secondary | ICD-10-CM | POA: Diagnosis not present

## 2019-11-04 DIAGNOSIS — D500 Iron deficiency anemia secondary to blood loss (chronic): Secondary | ICD-10-CM

## 2019-11-04 MED ORDER — SODIUM CHLORIDE 0.9 % IV SOLN
200.0000 mg | Freq: Once | INTRAVENOUS | Status: AC
  Administered 2019-11-04: 200 mg via INTRAVENOUS
  Filled 2019-11-04: qty 200

## 2019-11-04 MED ORDER — SODIUM CHLORIDE 0.9 % IV SOLN
Freq: Once | INTRAVENOUS | Status: AC
  Filled 2019-11-04: qty 250

## 2019-11-04 NOTE — Patient Instructions (Signed)

## 2019-11-06 ENCOUNTER — Inpatient Hospital Stay: Payer: Medicare HMO

## 2019-11-06 ENCOUNTER — Other Ambulatory Visit: Payer: Self-pay

## 2019-11-06 ENCOUNTER — Ambulatory Visit: Payer: Medicare HMO

## 2019-11-06 VITALS — BP 122/77 | HR 92 | Temp 97.9°F | Resp 18

## 2019-11-06 DIAGNOSIS — Z86010 Personal history of colonic polyps: Secondary | ICD-10-CM | POA: Diagnosis not present

## 2019-11-06 DIAGNOSIS — D500 Iron deficiency anemia secondary to blood loss (chronic): Secondary | ICD-10-CM

## 2019-11-06 DIAGNOSIS — D509 Iron deficiency anemia, unspecified: Secondary | ICD-10-CM | POA: Diagnosis not present

## 2019-11-06 DIAGNOSIS — K648 Other hemorrhoids: Secondary | ICD-10-CM | POA: Diagnosis not present

## 2019-11-06 DIAGNOSIS — Z9119 Patient's noncompliance with other medical treatment and regimen: Secondary | ICD-10-CM | POA: Diagnosis not present

## 2019-11-06 MED ORDER — SODIUM CHLORIDE 0.9 % IV SOLN
200.0000 mg | Freq: Once | INTRAVENOUS | Status: AC
  Administered 2019-11-06: 200 mg via INTRAVENOUS
  Filled 2019-11-06: qty 200

## 2019-11-06 MED ORDER — SODIUM CHLORIDE 0.9 % IV SOLN
Freq: Once | INTRAVENOUS | Status: AC
  Filled 2019-11-06: qty 250

## 2019-11-06 NOTE — Patient Instructions (Signed)

## 2019-11-11 ENCOUNTER — Other Ambulatory Visit: Payer: Self-pay

## 2019-11-11 ENCOUNTER — Inpatient Hospital Stay: Payer: Medicare HMO

## 2019-11-11 VITALS — BP 105/66 | HR 80 | Temp 97.7°F | Resp 18

## 2019-11-11 DIAGNOSIS — D500 Iron deficiency anemia secondary to blood loss (chronic): Secondary | ICD-10-CM

## 2019-11-11 DIAGNOSIS — K648 Other hemorrhoids: Secondary | ICD-10-CM | POA: Diagnosis not present

## 2019-11-11 DIAGNOSIS — Z9119 Patient's noncompliance with other medical treatment and regimen: Secondary | ICD-10-CM | POA: Diagnosis not present

## 2019-11-11 DIAGNOSIS — Z86010 Personal history of colonic polyps: Secondary | ICD-10-CM | POA: Diagnosis not present

## 2019-11-11 DIAGNOSIS — D509 Iron deficiency anemia, unspecified: Secondary | ICD-10-CM | POA: Diagnosis not present

## 2019-11-11 MED ORDER — SODIUM CHLORIDE 0.9 % IV SOLN
Freq: Once | INTRAVENOUS | Status: AC
  Filled 2019-11-11: qty 250

## 2019-11-11 MED ORDER — SODIUM CHLORIDE 0.9 % IV SOLN
200.0000 mg | Freq: Once | INTRAVENOUS | Status: AC
  Administered 2019-11-11: 200 mg via INTRAVENOUS
  Filled 2019-11-11: qty 200

## 2019-11-11 NOTE — Patient Instructions (Signed)

## 2019-11-13 ENCOUNTER — Other Ambulatory Visit: Payer: Self-pay

## 2019-11-13 ENCOUNTER — Inpatient Hospital Stay: Payer: Medicare HMO

## 2019-11-13 VITALS — BP 106/76 | HR 74 | Temp 97.8°F | Resp 17 | Ht 67.0 in

## 2019-11-13 DIAGNOSIS — Z9119 Patient's noncompliance with other medical treatment and regimen: Secondary | ICD-10-CM | POA: Diagnosis not present

## 2019-11-13 DIAGNOSIS — D509 Iron deficiency anemia, unspecified: Secondary | ICD-10-CM | POA: Diagnosis not present

## 2019-11-13 DIAGNOSIS — K648 Other hemorrhoids: Secondary | ICD-10-CM | POA: Diagnosis not present

## 2019-11-13 DIAGNOSIS — D500 Iron deficiency anemia secondary to blood loss (chronic): Secondary | ICD-10-CM

## 2019-11-13 DIAGNOSIS — Z86010 Personal history of colonic polyps: Secondary | ICD-10-CM | POA: Diagnosis not present

## 2019-11-13 MED ORDER — SODIUM CHLORIDE 0.9 % IV SOLN
200.0000 mg | Freq: Once | INTRAVENOUS | Status: AC
  Administered 2019-11-13: 200 mg via INTRAVENOUS
  Filled 2019-11-13: qty 200

## 2019-11-13 MED ORDER — SODIUM CHLORIDE 0.9 % IV SOLN
Freq: Once | INTRAVENOUS | Status: AC
  Filled 2019-11-13: qty 250

## 2019-11-13 NOTE — Patient Instructions (Signed)

## 2019-11-17 DIAGNOSIS — E785 Hyperlipidemia, unspecified: Secondary | ICD-10-CM | POA: Diagnosis not present

## 2019-11-17 DIAGNOSIS — I70219 Atherosclerosis of native arteries of extremities with intermittent claudication, unspecified extremity: Secondary | ICD-10-CM | POA: Diagnosis not present

## 2019-11-17 DIAGNOSIS — Z1389 Encounter for screening for other disorder: Secondary | ICD-10-CM | POA: Diagnosis not present

## 2019-11-17 DIAGNOSIS — Z1331 Encounter for screening for depression: Secondary | ICD-10-CM | POA: Diagnosis not present

## 2019-11-17 DIAGNOSIS — D485 Neoplasm of uncertain behavior of skin: Secondary | ICD-10-CM | POA: Diagnosis not present

## 2019-11-17 DIAGNOSIS — R002 Palpitations: Secondary | ICD-10-CM | POA: Diagnosis not present

## 2019-11-17 DIAGNOSIS — I10 Essential (primary) hypertension: Secondary | ICD-10-CM | POA: Diagnosis not present

## 2019-11-17 DIAGNOSIS — R1032 Left lower quadrant pain: Secondary | ICD-10-CM | POA: Diagnosis not present

## 2019-11-17 DIAGNOSIS — H9113 Presbycusis, bilateral: Secondary | ICD-10-CM | POA: Diagnosis not present

## 2019-11-17 DIAGNOSIS — Z0000 Encounter for general adult medical examination without abnormal findings: Secondary | ICD-10-CM | POA: Diagnosis not present

## 2019-11-17 DIAGNOSIS — R42 Dizziness and giddiness: Secondary | ICD-10-CM | POA: Diagnosis not present

## 2019-11-20 DIAGNOSIS — R69 Illness, unspecified: Secondary | ICD-10-CM | POA: Diagnosis not present

## 2019-11-21 DIAGNOSIS — J449 Chronic obstructive pulmonary disease, unspecified: Secondary | ICD-10-CM | POA: Diagnosis not present

## 2019-11-21 DIAGNOSIS — I10 Essential (primary) hypertension: Secondary | ICD-10-CM | POA: Diagnosis not present

## 2019-11-21 DIAGNOSIS — R062 Wheezing: Secondary | ICD-10-CM | POA: Diagnosis not present

## 2019-11-23 DIAGNOSIS — M19041 Primary osteoarthritis, right hand: Secondary | ICD-10-CM | POA: Diagnosis not present

## 2019-11-23 DIAGNOSIS — R2231 Localized swelling, mass and lump, right upper limb: Secondary | ICD-10-CM | POA: Diagnosis not present

## 2019-11-24 ENCOUNTER — Other Ambulatory Visit: Payer: Self-pay | Admitting: Orthopedic Surgery

## 2019-12-11 DIAGNOSIS — R69 Illness, unspecified: Secondary | ICD-10-CM | POA: Diagnosis not present

## 2019-12-16 ENCOUNTER — Other Ambulatory Visit: Payer: Self-pay

## 2019-12-16 ENCOUNTER — Other Ambulatory Visit (HOSPITAL_COMMUNITY)
Admission: RE | Admit: 2019-12-16 | Discharge: 2019-12-16 | Disposition: A | Discharge status: Home / Self Care | Payer: Medicare HMO | Source: Ambulatory Visit | Attending: Orthopedic Surgery | Admitting: Orthopedic Surgery

## 2019-12-16 ENCOUNTER — Encounter (HOSPITAL_COMMUNITY): Payer: Self-pay | Admitting: Orthopedic Surgery

## 2019-12-16 DIAGNOSIS — Z20822 Contact with and (suspected) exposure to covid-19: Secondary | ICD-10-CM | POA: Diagnosis not present

## 2019-12-16 LAB — SARS CORONAVIRUS 2 (TAT 6-24 HRS): SARS Coronavirus 2: NEGATIVE

## 2019-12-16 NOTE — Progress Notes (Signed)
Pt SDW-pre-op call completed by both pt brother Dr. Lajean Manes ([place]) and his spouse . Brother denies that pt has a cardiac history . Brother denies that pt had a stress test, echo and cardiac cath. Brother denies that pt had a chest x ray and EKG. Brother denies recent labs. Brother made aware to have pt stop taking, Primatine Allergy (Ephedrine),  Aspirin (unless otherwise advised by surgeon), vitamins, fish oil and herbal medications. Do not take any NSAIDs ie: Ibuprofen, Advil, Naproxen (Aleve), Motrin, BC and Goody Powder. Pt reminded to quarantine. Brother verbalized understanding of all pre-op instructions. Brother reminded to have pt continue to quarantine. Brother and spouse verbalized understanding of all pre-op instructions.

## 2019-12-17 ENCOUNTER — Ambulatory Visit: Payer: Medicare HMO | Admitting: Emergency Medicine

## 2019-12-17 ENCOUNTER — Encounter: Payer: Self-pay | Admitting: Emergency Medicine

## 2019-12-17 DIAGNOSIS — J9611 Chronic respiratory failure with hypoxia: Secondary | ICD-10-CM

## 2019-12-17 DIAGNOSIS — F17200 Nicotine dependence, unspecified, uncomplicated: Secondary | ICD-10-CM | POA: Diagnosis not present

## 2019-12-17 DIAGNOSIS — I10 Essential (primary) hypertension: Secondary | ICD-10-CM | POA: Diagnosis not present

## 2019-12-17 DIAGNOSIS — J449 Chronic obstructive pulmonary disease, unspecified: Secondary | ICD-10-CM

## 2019-12-17 DIAGNOSIS — R69 Illness, unspecified: Secondary | ICD-10-CM | POA: Diagnosis not present

## 2019-12-17 NOTE — Progress Notes (Signed)
° °  Subjective:    Patient ID: Tyler Horne, male    DOB: 01-01-1941, 79 y.o.   MRN: [id number]  HPI  ROV 12/17/19 --follow-up visit for 79 year old man with history of tobacco use and very severe COPD. He quit smoking in April 2021.  He also has GERD and chronic rhinitis, documented hypoxemic respiratory failure.  He is using oxygen at 2 L/min, has been shown to desaturate on this in the past but appears to be tolerating today. He has a pulsed system. He is using DuoNeb as needed, but he doesn't take the whole treatment, leaves the remainder for later. He is not interested in taking a full treatment on the schedule I've prescribed. He will still sometimes use Primatene mist. No flares, no ED visits since last time. He has been treated for Fe def anemia, has a negative CSY and EGD.   Review of Systems  Constitutional: Negative.  Negative for fever and unexpected weight change.  HENT: Positive for postnasal drip and rhinorrhea. Negative for congestion, dental problem, ear pain, nosebleeds, sinus pressure, sneezing, sore throat and trouble swallowing.   Eyes: Negative.  Negative for redness and itching.  Respiratory: Positive for cough and shortness of breath. Negative for chest tightness and wheezing.   Cardiovascular: Negative.  Negative for palpitations and leg swelling.  Gastrointestinal: Negative.  Negative for nausea and vomiting.  Genitourinary: Negative.  Negative for dysuria.  Musculoskeletal: Negative.  Negative for joint swelling.  Skin: Negative.  Negative for rash.  Neurological: Negative.  Negative for headaches.  Hematological: Negative.  Does not bruise/bleed easily.  Psychiatric/Behavioral: Negative.  Negative for dysphoric mood. The patient is not nervous/anxious.        Objective:   Physical Exam Vitals:   12/17/19 1531  BP: 104/60  Pulse: 84  Temp: 98.6 F (37 C)  TempSrc: Oral  SpO2: 96%  Weight: 138 lb 3.2 oz (62.7 kg)  Height: 5\' 7"  (1.702 m)   Gen: Pleasant, thin  with some temporal wasting, in no distress,  normal affect  ENT: No lesions,  mouth clear,  oropharynx clear, no postnasal drip  Neck: No JVD, no stridor  Lungs: No use of accessory muscles, distant and decreased throughout, few exp wheezes.   Cardiovascular: RRR, heart sounds normal, no murmur or gallops, no peripheral edema  Musculoskeletal: No deformities, no cyanosis or clubbing  Neuro: alert, non focal  Skin: Warm, no lesions or rashes     Assessment & Plan:  COPD (chronic obstructive pulmonary disease) (HCC) Severe disease.  He has not had any flares but he is profoundly limited in his activity.  He does not use his nebulized treatments as I recommended, instead uses a partial neb intermittently and repeatedly through the day.  I tried to underscore the importance of using his full DuoNeb every 6 hours  Chronic respiratory failure with hypoxia ([place]) .  On 2 L/min pulsed currently and tolerating.  I have asked him to titrate 2 to 4 L/min  Tobacco use disorder States that he quit last April.  I congratulated him on this.  Baltazar Apo, MD, PhD 12/17/2019, 3:56 PM  Pulmonary and Critical Care [phone] or if no answer [phone]

## 2019-12-17 NOTE — Assessment & Plan Note (Signed)
Severe disease.  He has not had any flares but he is profoundly limited in his activity.  He does not use his nebulized treatments as I recommended, instead uses a partial neb intermittently and repeatedly through the day.  I tried to underscore the importance of using his full DuoNeb every 6 hours

## 2019-12-17 NOTE — Assessment & Plan Note (Signed)
States that he quit last April.  I congratulated him on this.

## 2019-12-17 NOTE — Assessment & Plan Note (Signed)
.    On 2 L/min pulsed currently and tolerating.  I have asked him to titrate 2 to 4 L/min

## 2019-12-17 NOTE — Patient Instructions (Signed)
Continue your oxygen at 2 to 4 L/min at all times. Continue your DuoNeb as your maintenance medication.  You would benefit from taking a full nebulizer treatment 4 times a day on a schedule. Keep albuterol available use 2 puffs if you need it for shortness of breath, chest tightness, wheezing. Congratulations on stopping smoking Follow with Dr. Lamonte Sakai in 12 months or sooner if you have any problems.

## 2019-12-18 ENCOUNTER — Encounter (HOSPITAL_COMMUNITY): Payer: Self-pay | Admitting: Orthopedic Surgery

## 2019-12-18 ENCOUNTER — Ambulatory Visit (HOSPITAL_COMMUNITY): Payer: Medicare HMO | Admitting: Anesthesiology

## 2019-12-18 ENCOUNTER — Other Ambulatory Visit: Payer: Self-pay

## 2019-12-18 ENCOUNTER — Encounter (HOSPITAL_COMMUNITY)
Admission: RE | Disposition: A | Discharge status: Home / Self Care | Payer: Self-pay | Source: Home / Self Care | Attending: Orthopedic Surgery

## 2019-12-18 ENCOUNTER — Ambulatory Visit (HOSPITAL_COMMUNITY)
Admission: RE | Admit: 2019-12-18 | Discharge: 2019-12-18 | Disposition: A | Discharge status: Home / Self Care | Payer: Medicare HMO | Attending: Orthopedic Surgery | Admitting: Orthopedic Surgery

## 2019-12-18 DIAGNOSIS — M795 Residual foreign body in soft tissue: Secondary | ICD-10-CM | POA: Diagnosis not present

## 2019-12-18 DIAGNOSIS — J449 Chronic obstructive pulmonary disease, unspecified: Secondary | ICD-10-CM | POA: Insufficient documentation

## 2019-12-18 DIAGNOSIS — D500 Iron deficiency anemia secondary to blood loss (chronic): Secondary | ICD-10-CM | POA: Diagnosis not present

## 2019-12-18 DIAGNOSIS — M25741 Osteophyte, right hand: Secondary | ICD-10-CM | POA: Insufficient documentation

## 2019-12-18 DIAGNOSIS — Z87891 Personal history of nicotine dependence: Secondary | ICD-10-CM | POA: Insufficient documentation

## 2019-12-18 DIAGNOSIS — R2231 Localized swelling, mass and lump, right upper limb: Secondary | ICD-10-CM | POA: Insufficient documentation

## 2019-12-18 DIAGNOSIS — M19041 Primary osteoarthritis, right hand: Secondary | ICD-10-CM | POA: Insufficient documentation

## 2019-12-18 DIAGNOSIS — Z9981 Dependence on supplemental oxygen: Secondary | ICD-10-CM | POA: Diagnosis not present

## 2019-12-18 DIAGNOSIS — I10 Essential (primary) hypertension: Secondary | ICD-10-CM | POA: Diagnosis not present

## 2019-12-18 DIAGNOSIS — D3612 Benign neoplasm of peripheral nerves and autonomic nervous system, upper limb, including shoulder: Secondary | ICD-10-CM | POA: Diagnosis not present

## 2019-12-18 HISTORY — DX: Localized swelling, mass and lump, unspecified upper limb: R22.30

## 2019-12-18 HISTORY — PX: CYST EXCISION: SHX5701

## 2019-12-18 LAB — CBC
HCT: 37.8 % — ABNORMAL LOW (ref 39.0–52.0)
Hemoglobin: 12.1 g/dL — ABNORMAL LOW (ref 13.0–17.0)
MCH: 31.1 pg (ref 26.0–34.0)
MCHC: 32 g/dL (ref 30.0–36.0)
MCV: 97.2 fL (ref 80.0–100.0)
Platelets: 245 10*3/uL (ref 150–400)
RBC: 3.89 MIL/uL — ABNORMAL LOW (ref 4.22–5.81)
RDW: 13 % (ref 11.5–15.5)
WBC: 5.7 10*3/uL (ref 4.0–10.5)
nRBC: 0 % (ref 0.0–0.2)

## 2019-12-18 SURGERY — CYST REMOVAL
Anesthesia: Monitor Anesthesia Care | Site: Index Finger | Laterality: Right

## 2019-12-18 MED ORDER — FENTANYL CITRATE (PF) 250 MCG/5ML IJ SOLN
INTRAMUSCULAR | Status: AC
  Filled 2019-12-18: qty 5

## 2019-12-18 MED ORDER — ONDANSETRON HCL 4 MG/2ML IJ SOLN
INTRAMUSCULAR | Status: DC | PRN
  Administered 2019-12-18: 4 mg via INTRAVENOUS

## 2019-12-18 MED ORDER — BUPIVACAINE HCL (PF) 0.25 % IJ SOLN
INTRAMUSCULAR | Status: AC
  Filled 2019-12-18: qty 30

## 2019-12-18 MED ORDER — LACTATED RINGERS IV SOLN: INTRAVENOUS | Status: DC

## 2019-12-18 MED ORDER — MEPERIDINE HCL 25 MG/ML IJ SOLN: 6.2500 mg | INTRAMUSCULAR | Status: DC | PRN

## 2019-12-18 MED ORDER — PROPOFOL 10 MG/ML IV BOLUS
INTRAVENOUS | Status: AC
  Filled 2019-12-18: qty 20

## 2019-12-18 MED ORDER — LIDOCAINE HCL (PF) 0.5 % IJ SOLN
INTRAMUSCULAR | Status: DC | PRN
  Administered 2019-12-18: 30 mL via INTRAVENOUS

## 2019-12-18 MED ORDER — ONDANSETRON HCL 4 MG/2ML IJ SOLN: 4.0000 mg | Freq: Once | INTRAMUSCULAR | Status: DC | PRN

## 2019-12-18 MED ORDER — CEFAZOLIN SODIUM-DEXTROSE 2-4 GM/100ML-% IV SOLN
2.0000 g | INTRAVENOUS | Status: AC
  Administered 2019-12-18: 2 g via INTRAVENOUS
  Filled 2019-12-18: qty 100

## 2019-12-18 MED ORDER — ACETAMINOPHEN 325 MG PO TABS: 325.0000 mg | ORAL_TABLET | ORAL | Status: DC | PRN

## 2019-12-18 MED ORDER — CHLORHEXIDINE GLUCONATE 0.12 % MT SOLN
OROMUCOSAL | Status: AC
  Administered 2019-12-18: 15 mL via OROMUCOSAL
  Filled 2019-12-18: qty 15

## 2019-12-18 MED ORDER — PHENYLEPHRINE 40 MCG/ML (10ML) SYRINGE FOR IV PUSH (FOR BLOOD PRESSURE SUPPORT)
PREFILLED_SYRINGE | INTRAVENOUS | Status: AC
  Filled 2019-12-18: qty 10

## 2019-12-18 MED ORDER — LIDOCAINE 2% (20 MG/ML) 5 ML SYRINGE
INTRAMUSCULAR | Status: AC
  Filled 2019-12-18: qty 5

## 2019-12-18 MED ORDER — ACETAMINOPHEN 160 MG/5ML PO SOLN: 325.0000 mg | ORAL | Status: DC | PRN

## 2019-12-18 MED ORDER — ONDANSETRON HCL 4 MG/2ML IJ SOLN
INTRAMUSCULAR | Status: AC
  Filled 2019-12-18: qty 2

## 2019-12-18 MED ORDER — ORAL CARE MOUTH RINSE: 15.0000 mL | Freq: Once | OROMUCOSAL | Status: AC

## 2019-12-18 MED ORDER — FENTANYL CITRATE (PF) 100 MCG/2ML IJ SOLN: 25.0000 ug | INTRAMUSCULAR | Status: DC | PRN

## 2019-12-18 MED ORDER — PROPOFOL 500 MG/50ML IV EMUL
INTRAVENOUS | Status: DC | PRN
  Administered 2019-12-18: 75 ug/kg/min via INTRAVENOUS

## 2019-12-18 MED ORDER — CHLORHEXIDINE GLUCONATE 0.12 % MT SOLN: 15.0000 mL | Freq: Once | OROMUCOSAL | Status: AC

## 2019-12-18 MED ORDER — LACTATED RINGERS IV SOLN: INTRAVENOUS | Status: DC | PRN

## 2019-12-18 MED ORDER — FENTANYL CITRATE (PF) 100 MCG/2ML IJ SOLN
INTRAMUSCULAR | Status: DC | PRN
  Administered 2019-12-18 (×2): 25 ug via INTRAVENOUS

## 2019-12-18 MED ORDER — LIDOCAINE HCL (PF) 0.5 % IJ SOLN
INTRAMUSCULAR | Status: AC
  Filled 2019-12-18: qty 50

## 2019-12-18 MED ORDER — OXYCODONE HCL 5 MG/5ML PO SOLN: 5.0000 mg | Freq: Once | ORAL | Status: DC | PRN

## 2019-12-18 MED ORDER — PROPOFOL 10 MG/ML IV BOLUS
INTRAVENOUS | Status: DC | PRN
  Administered 2019-12-18 (×2): 20 mg via INTRAVENOUS
  Administered 2019-12-18 (×2): 30 mg via INTRAVENOUS

## 2019-12-18 MED ORDER — PHENYLEPHRINE 40 MCG/ML (10ML) SYRINGE FOR IV PUSH (FOR BLOOD PRESSURE SUPPORT)
PREFILLED_SYRINGE | INTRAVENOUS | Status: DC | PRN
  Administered 2019-12-18 (×2): 80 ug via INTRAVENOUS
  Administered 2019-12-18: 120 ug via INTRAVENOUS
  Administered 2019-12-18: 80 ug via INTRAVENOUS

## 2019-12-18 MED ORDER — LIDOCAINE 2% (20 MG/ML) 5 ML SYRINGE
INTRAMUSCULAR | Status: DC | PRN
  Administered 2019-12-18: 40 mg via INTRAVENOUS

## 2019-12-18 MED ORDER — OXYCODONE HCL 5 MG PO TABS: 5.0000 mg | ORAL_TABLET | Freq: Once | ORAL | Status: DC | PRN

## 2019-12-18 MED ORDER — LIDOCAINE HCL 1 % IJ SOLN
INTRAMUSCULAR | Status: AC
  Filled 2019-12-18: qty 20

## 2019-12-18 MED ORDER — BUPIVACAINE HCL (PF) 0.25 % IJ SOLN
INTRAMUSCULAR | Status: DC | PRN
  Administered 2019-12-18: 9 mL

## 2019-12-18 SURGICAL SUPPLY — 45 items
BLADE MINI RND TIP GREEN BEAV (BLADE) IMPLANT
BLADE SURG 15 STRL LF DISP TIS (BLADE) ×1 IMPLANT
BLADE SURG 15 STRL SS (BLADE) ×2
BNDG COHESIVE 1X5 TAN STRL LF (GAUZE/BANDAGES/DRESSINGS) ×3 IMPLANT
BNDG COHESIVE 2X5 TAN STRL LF (GAUZE/BANDAGES/DRESSINGS) IMPLANT
BNDG COHESIVE 3X5 TAN STRL LF (GAUZE/BANDAGES/DRESSINGS) IMPLANT
BNDG ESMARK 4X9 LF (GAUZE/BANDAGES/DRESSINGS) ×3 IMPLANT
BNDG GAUZE ELAST 4 BULKY (GAUZE/BANDAGES/DRESSINGS) IMPLANT
CHLORAPREP W/TINT 26 (MISCELLANEOUS) ×3 IMPLANT
CORD BIPOLAR FORCEPS 12FT (ELECTRODE) ×3 IMPLANT
COVER BACK TABLE 60X90IN (DRAPES) ×3 IMPLANT
COVER MAYO STAND STRL (DRAPES) ×3 IMPLANT
COVER WAND RF STERILE (DRAPES) IMPLANT
CUFF TOURN SGL QUICK 18X4 (TOURNIQUET CUFF) ×3 IMPLANT
DECANTER SPIKE VIAL GLASS SM (MISCELLANEOUS) IMPLANT
DRAIN PENROSE 1/2X12 LTX STRL (WOUND CARE) ×3 IMPLANT
DRAPE EXTREMITY T 121X128X90 (DISPOSABLE) ×3 IMPLANT
DRAPE SURG 17X23 STRL (DRAPES) ×3 IMPLANT
GAUZE SPONGE 4X4 12PLY STRL (GAUZE/BANDAGES/DRESSINGS) ×3 IMPLANT
GAUZE XEROFORM 1X8 LF (GAUZE/BANDAGES/DRESSINGS) ×3 IMPLANT
GLOVE BIOGEL PI IND STRL 8.5 (GLOVE) ×1 IMPLANT
GLOVE BIOGEL PI INDICATOR 8.5 (GLOVE) ×2
GLOVE SURG ORTHO 8.0 STRL STRW (GLOVE) ×3 IMPLANT
GOWN STRL REUS W/ TWL LRG LVL3 (GOWN DISPOSABLE) ×1 IMPLANT
GOWN STRL REUS W/TWL LRG LVL3 (GOWN DISPOSABLE) ×2
GOWN STRL REUS W/TWL XL LVL3 (GOWN DISPOSABLE) ×3 IMPLANT
NEEDLE PRECISIONGLIDE 27X1.5 (NEEDLE) IMPLANT
NS IRRIG 1000ML POUR BTL (IV SOLUTION) ×3 IMPLANT
PACK BASIN DAY SURGERY FS (CUSTOM PROCEDURE TRAY) ×3 IMPLANT
PAD CAST 3X4 CTTN HI CHSV (CAST SUPPLIES) IMPLANT
PADDING CAST ABS 3INX4YD NS (CAST SUPPLIES)
PADDING CAST ABS 4INX4YD NS (CAST SUPPLIES) ×2
PADDING CAST ABS COTTON 3X4 (CAST SUPPLIES) IMPLANT
PADDING CAST ABS COTTON 4X4 ST (CAST SUPPLIES) ×1 IMPLANT
PADDING CAST COTTON 3X4 STRL (CAST SUPPLIES)
SPLINT PLASTER CAST XFAST 3X15 (CAST SUPPLIES) IMPLANT
SPLINT PLASTER XTRA FASTSET 3X (CAST SUPPLIES)
STOCKINETTE 4X48 STRL (DRAPES) ×3 IMPLANT
SUT ETHILON 4 0 PS 2 18 (SUTURE) IMPLANT
SUT ETHILON 5 0 PS 2 18 (SUTURE) ×3 IMPLANT
SUT VIC AB 4-0 P2 18 (SUTURE) IMPLANT
SYR BULB EAR ULCER 3OZ GRN STR (SYRINGE) ×3 IMPLANT
SYR CONTROL 10ML LL (SYRINGE) IMPLANT
TOWEL GREEN STERILE FF (TOWEL DISPOSABLE) ×6 IMPLANT
UNDERPAD 30X36 HEAVY ABSORB (UNDERPADS AND DIAPERS) ×3 IMPLANT

## 2019-12-18 NOTE — Discharge Instructions (Addendum)
Monitored Anesthesia Care Anesthesia is a term that refers to techniques, procedures, and medicines that help a person stay safe and comfortable during a medical procedure. Monitored anesthesia care, or sedation, is one type of anesthesia. Your anesthesia specialist may recommend sedation if you will be having a procedure that does not require you to be unconscious, such as:  Cataract surgery.  A dental procedure.  A biopsy.  A colonoscopy. During the procedure, you may receive a medicine to help you relax (sedative). There are three levels of sedation:  Mild sedation. At this level, you may feel awake and relaxed. You will be able to follow directions.  Moderate sedation. At this level, you will be sleepy. You may not remember the procedure.  Deep sedation. At this level, you will be asleep. You will not remember the procedure. The more medicine you are given, the deeper your level of sedation will be. Depending on how you respond to the procedure, the anesthesia specialist may change your level of sedation or the type of anesthesia to fit your needs. An anesthesia specialist will monitor you closely during the procedure. Let your health care provider know about:  Any allergies you have.  All medicines you are taking, including vitamins, herbs, eye drops, creams, and over-the-counter medicines.  Any use of steroids (by mouth or as a cream).  Any problems you or family members have had with sedatives and anesthetic medicines.  Any blood disorders you have.  Any surgeries you have had.  Any medical conditions you have, such as sleep apnea.  Whether you are pregnant or may be pregnant.  Any use of cigarettes, alcohol, or street drugs. What are the risks? Generally, this is a safe procedure. However, problems may occur, including:  Getting too much medicine (oversedation).  Nausea.  Allergic reaction to medicines.  Trouble breathing. If this happens, a breathing tube may be  used to help with breathing. It will be removed when you are awake and breathing on your own.  Heart trouble.  Lung trouble. Before the procedure Staying hydrated Follow instructions from your health care provider about hydration, which may include:  Up to 2 hours before the procedure - you may continue to drink clear liquids, such as water, clear fruit juice, black coffee, and plain tea. Eating and drinking restrictions Follow instructions from your health care provider about eating and drinking, which may include:  8 hours before the procedure - stop eating heavy meals or foods such as meat, fried foods, or fatty foods.  6 hours before the procedure - stop eating light meals or foods, such as toast or cereal.  6 hours before the procedure - stop drinking milk or drinks that contain milk.  2 hours before the procedure - stop drinking clear liquids. Medicines Ask your health care provider about:  Changing or stopping your regular medicines. This is especially important if you are taking diabetes medicines or blood thinners.  Taking medicines such as aspirin and ibuprofen. These medicines can thin your blood. Do not take these medicines before your procedure if your health care provider instructs you not to. Tests and exams  You will have a physical exam.  You may have blood tests done to show: ? How well your kidneys and liver are working. ? How well your blood can clot. General instructions  Plan to have someone take you home from the hospital or clinic.  If you will be going home right after the procedure, plan to have someone with you  for 24 hours.  What happens during the procedure?  Your blood pressure, heart rate, breathing, level of pain and overall condition will be monitored.  An IV tube will be inserted into one of your veins.  Your anesthesia specialist will give you medicines as needed to keep you comfortable during the procedure. This may mean changing the  level of sedation.  The procedure will be performed. After the procedure  Your blood pressure, heart rate, breathing rate, and blood oxygen level will be monitored until the medicines you were given have worn off.  Do not drive for 24 hours if you received a sedative.  You may: ? Feel sleepy, clumsy, or nauseous. ? Feel forgetful about what happened after the procedure. ? Have a sore throat if you had a breathing tube during the procedure. ? Vomit. This information is not intended to replace advice given to you by your health care provider. Make sure you discuss any questions you have with your health care provider. Document Revised: 05/03/2017 Document Reviewed: 09/11/2015 Elsevier Patient Education  [place] Anesthesia  Regional anesthesia is a method used to temporarily block feeling in one area of the body. You may have regional anesthesia before a medical procedure or surgery. A health care provider who specializes in giving anesthesia (anesthesiologist) injects a type of medicine near a nerve or a group of nerves. This medicine makes that area of the body numb. Regional anesthesia allows you to be awake during the procedure or surgery but keeps you from feeling pain in the affected area. There are three types of regional anesthesia:  Spinal anesthesia. This is a one-time injection of medicine into the fluid that surrounds your spinal cord. This numbs the area below and slightly above the injection site.  Epidural anesthesia. This is another medicine that may be placed into your back, but just outside of the protective tissue that covers your spinal cord. Instead of a one-time injection, the medicine is often given gradually over time through a small tube (catheter)that remains in your back for as long as pain control is needed.  Peripheral nerve block. This is an injection that is given in an area of the body other than the spine to block all feeling below the  injection site. Peripheral nerve blocks may be given as a single injection before your procedure or may be given through a catheter for as long as you need pain control. Regional anesthesia can be used alone or in combination with other types of anesthesia. Compared to using medicine that makes you fall asleep (general anesthetic), regional anesthesia has many benefits, such as:  Improved pain control after your surgery.  Less nausea, vomiting, or drowsiness after surgery.  A faster recovery. Tell a health care provider about:  Any allergies you have.  All medicines you are taking, including vitamins, herbs, eye drops, creams, and over-the-counter medicines.  Any use of drugs, alcohol, or tobacco.  Any problems you or family members have had with anesthetic medicines.  Any blood disorders you have.  Any surgeries you have had.  Any medical conditions you have or have had, especially heart failure, chronic obstructive pulmonary disease (COPD), or sleep apnea.  Whether you are pregnant or may be pregnant. What are the risks? Generally, this is a safe procedure. However, problems may occur, including:  Pain.  Nausea.  Vomiting.  Itching.  Low blood pressure.  Headache.  Nerve damage.  Infection.  Bleeding around the injection site.  Trouble urinating.  Allergic reactions to medicine. What happens before the procedure? Staying hydrated Follow instructions from your health care provider about hydration, which may include:  Up to 2 hours before the procedure - you may continue to drink clear liquids, such as water, clear fruit juice, black coffee, and plain tea. Eating and drinking restrictions Follow instructions from your health care provider about eating and drinking, which may include:  8 hours before the procedure - stop eating heavy meals or foods, such as meat, fried foods, or fatty foods.  6 hours before the procedure - stop eating light meals or foods,  such as toast or cereal.  6 hours before the procedure - stop drinking milk or drinks that contain milk.  2 hours before the procedure - stop drinking clear liquids. Medicines Ask your health care provider about:  Changing or stopping your regular medicines. This is especially important if you are taking diabetes medicines or blood thinners.  Taking medicines such as aspirin and ibuprofen. These medicines can thin your blood. Do not take these medicines unless your health care provider tells you to take them.  Taking over-the-counter medicines, vitamins, herbs, and supplements. General instructions  Plan to have someone take you home from the hospital or clinic.  If you will be going home right after the procedure, plan to have someone with you for 24 hours.  You may need to have blood or imaging tests.  Ask your health care provider what steps will be taken to help prevent infection. These may include washing skin with a germ-killing soap.  If you use a sleep apnea device, ask your health care provider whether you should bring it with you on the day of your surgery. What happens during the procedure?  Depending on the medical procedure you are having done, an IV may be inserted into one of your veins.  The anesthesiologist will do a physical exam to find the best location to give the regional anesthesia. To locate the nerve, he or she may also use: ? A device that activates the nerve and causes your muscles to twitch (nerve stimulator). ? An imaging tool that uses sound waves to create images of the area (ultrasound).  You may be given a medicine to help you relax (sedative).  A medicine called a local anesthetic may be injected to numb the area where the regional anesthetic will be injected.  You will get regional anesthesia by injection or through a catheter.  The anesthesiologist will check to make sure the medicine is working before the rest of your medical procedure  begins.  Depending on the type of regional anesthesia you received, you may have a small bandage (dressing) placed over the injection site. The procedure may vary among health care providers and hospitals. What can I expect after the procedure? After your procedure, it is common to have:  Sleepiness.  Nausea.  Itching.  Numbness.  Shivering or feeling cold. Your blood pressure, heart rate, breathing rate, and blood oxygen level will be monitored until you leave the hospital or clinic. Follow these instructions at home:  Do not drive for 24 hours if you were given a sedative during your procedure.  Take over-the-counter and prescription medicines only as told by your health care provider.  Do not drive, exercise, or do any other activities that require coordination for 24 hours or as told by your health care provider. Ask your health care provider when you can return to your usual activities.  Drink enough fluid to  keep your urine pale yellow.  If you had a dressing placed over the injection site, only remove it when told to do so by your health care provider.  Keep all follow-up visits as told by your health care provider. This is important. Contact a health care provider if you:  Continue to have nausea and vomiting for more than 1 day.  Develop a rash.  Have trouble urinating. Get help right away if you:  Have bleeding from the injection site or bleeding under the skin at the injection site.  Have redness, swelling, or pain around your injection site.  Have a fever.  Develop a headache.  Develop new numbness or weakness. Summary  Regional anesthesia is a method used to temporarily block feeling in one area of the body. It may be done to block pain during a medical procedure or surgery.  Follow instructions from your health care provider about taking medicines and about eating and drinking before the procedure.  Ask your health care provider when you can return  to your usual activities after the procedure. This information is not intended to replace advice given to you by your health care provider. Make sure you discuss any questions you have with your health care provider. Document Revised: 07/07/2018 Document Reviewed: 07/07/2018 Elsevier Patient Education  2020 [place] Instructions Hand Surgery  Wound Care: Keep your hand elevated above the level of your heart.  Do not allow it to dangle by your side.  Keep the dressing dry and do not remove it unless your doctor advises you to do so.  He will usually change it at the time of your post-op visit.  Moving your fingers is advised to stimulate circulation but will depend on the site of your surgery.  If you have a splint applied, your doctor will advise you regarding movement.  Activity: Do not drive or operate machinery today.  Rest today and then you may return to your normal activity and work as indicated by your physician.  Diet:  Drink liquids today or eat a light diet.  You may resume a regular diet tomorrow.    General expectations: Pain for two to three days. Fingers may become slightly swollen.  Call your doctor if any of the following occur: Severe pain not relieved by pain medication. Elevated temperature. Dressing soaked with blood. Inability to move fingers. White or bluish color to fingers. Patient states he has tramadol at home will call Monday for refill if necessary

## 2019-12-18 NOTE — Anesthesia Procedure Notes (Signed)
Anesthesia Regional Block: Bier block (IV Regional)   Pre-Anesthetic Checklist: ,, timeout performed, Correct Patient, Correct Site, Correct Laterality, Correct Procedure, Correct Position, site marked, Risks and benefits discussed, Surgical consent,  Pre-op evaluation,  At surgeon's request  Laterality: Right  Prep: chloraprep        Procedures:,,,,,, Esmarch exsanguination, single tourniquet utilized,  Narrative:   Performed by: With CRNAs  Anesthesiologist: Janeece Riggers, MD  Additional Notes: Right arm elevated, exsanguinated with Esmark, right forearm tourn inflated, great blanching of right hand noted.  IV regional Bier block performed 30 mL 0.5% PLAIN xylocaine and tol well by patient.

## 2019-12-18 NOTE — Brief Op Note (Signed)
12/18/2019  2:24 PM  PATIENT:  Tyler Horne  79 y.o. male  PRE-OPERATIVE DIAGNOSIS:  CYSTIC MASS RIGHT INDEX FINGER  POST-OPERATIVE DIAGNOSIS:  CYSTIC MASS RIGHT INDEX FINGER  PROCEDURE:  Procedure(s) with comments: EXCISION CYSTIC MASS WITH DISTAL INTERPHANLEGEAL JOINT ARTHROTOMY RIGHT INDEX FINGER (Right) - IV REGIONAL FOREARM BLOCK  SURGEON:  Surgeon(s) and Role:    * Daryll Brod, MD - Primary  PHYSICIAN ASSISTANT:   ASSISTANTS: none   ANESTHESIA:   local, regional and IV sedation  EBL:  1 mL   BLOOD ADMINISTERED:none  DRAINS: none   LOCAL MEDICATIONS USED:  BUPIVICAINE   SPECIMEN:  Excision  DISPOSITION OF SPECIMEN:  PATHOLOGY  COUNTS:  YES  TOURNIQUET:   Total Tourniquet Time Documented: Forearm (Right) - 27 minutes Total: Forearm (Right) - 27 minutes   DICTATION: .Viviann Spare Dictation  PLAN OF CARE: Discharge to home after PACU  PATIENT DISPOSITION:  PACU - hemodynamically stable.

## 2019-12-18 NOTE — H&P (Signed)
Tyler Horne is an 79 y.o. male.   Chief Complaint: mass right index finger LKT:GYBW is a 79 year old hand dominant male complaining of a mass on his right index finger radial aspect distal middle phalanx. He recalls no history of injury. Is painful if he hits it. He has been using CBD oil with some relief. Nothing makes it better or worse. He has change size slightly. Has no history of diabetes thyroid problems arthritis or gout. Family history is positive diabetearthritis negative for the remainder.    Past Medical History:  Diagnosis Date  . Anemia   . Arthritis    DDD, shoulder  . Asthma    hayfever, seasonal allergies, + smoker, uses primatene pill on occas.   [name] COPD (chronic obstructive pulmonary disease) ([place])   . GERD (gastroesophageal reflux disease)    uses Rolaids on rare occas.   . Mass of finger    cystic mass right index finger  . Postnasal drip    uses Benadryl & primatene as needed   . Supplemental oxygen dependent    continuous  . Wears dentures     Past Surgical History:  Procedure Laterality Date  . BIOPSY  06/18/2019   Procedure: BIOPSY;  Surgeon: Rush Landmark Telford Nab., MD;  Location: [facility];  Service: Gastroenterology;;  . COLONOSCOPY WITH PROPOFOL N/A 06/18/2019   Procedure: COLONOSCOPY WITH PROPOFOL;  Surgeon: Irving Copas., MD;  Location: [facility];  Service: Gastroenterology;  Laterality: N/A;  . ESOPHAGOGASTRODUODENOSCOPY (EGD) WITH PROPOFOL N/A 06/18/2019   Procedure: ESOPHAGOGASTRODUODENOSCOPY (EGD) WITH PROPOFOL;  Surgeon: Rush Landmark Telford Nab., MD;  Location: [facility];  Service: Gastroenterology;  Laterality: N/A;  . GANGLION CYST EXCISION Left    L hand   . LUMBAR LAMINECTOMY/DECOMPRESSION MICRODISCECTOMY Left 10/02/2012   Procedure: LUMBAR LAMINECTOMY/DECOMPRESSION MICRODISCECTOMY 2 LEVELS;  Surgeon: Ophelia Charter, MD;  Location: [facility];  Service: Neurosurgery;  Laterality: Left;  Left Lumbar four-five Lumbar five  sacral one diskectomy  . PILONIDAL CYST EXCISION    . POLYPECTOMY  06/18/2019   Procedure: POLYPECTOMY;  Surgeon: Mansouraty, Telford Nab., MD;  Location: [facility];  Service: Gastroenterology;;  . SHOULDER ARTHROSCOPY  2008   Left  . TONSILLECTOMY      Family History  Problem Relation Age of Onset  . Cancer Mother        type unknown  . Dementia Father   . Prostate cancer Father   . Diabetes Mellitus II Brother   . Colon cancer Neg Hx   . Esophageal cancer Neg Hx   . Inflammatory bowel disease Neg Hx   . Liver disease Neg Hx   . Pancreatic cancer Neg Hx   . Rectal cancer Neg Hx   . Stomach cancer Neg Hx    Social History:  reports that he quit smoking about 14 months ago. His smoking use included cigarettes. He has a 25.00 pack-year smoking history. He has never used smokeless tobacco. He reports current alcohol use. He reports that he does not use drugs.  Allergies: No Known Allergies  No medications prior to admission.    Results for orders placed or performed during the hospital encounter of 12/16/19 (from the past 48 hour(s))  SARS CORONAVIRUS 2 (TAT 6-24 HRS) Nasopharyngeal Nasopharyngeal Swab     Status: None   Collection Time: 12/16/19  2:14 PM   Specimen: Nasopharyngeal Swab  Result Value Ref Range   SARS Coronavirus 2 NEGATIVE NEGATIVE    Comment: (NOTE) SARS-CoV-2 target nucleic acids are NOT DETECTED.  The SARS-CoV-2 RNA is generally detectable in upper and lower respiratory specimens during the acute phase of infection. Negative results do not preclude SARS-CoV-2 infection, do not rule out co-infections with other pathogens, and should not be used as the sole basis for treatment or other patient management decisions. Negative results must be combined with clinical observations, patient history, and epidemiological information. The expected result is Negative.  Fact Sheet for Patients: SugarRoll.be  Fact Sheet for  Healthcare Providers: https://www.woods-mathews.com/  This test is not yet approved or cleared by the [place] FDA and  has been authorized for detection and/or diagnosis of SARS-CoV-2 by FDA under an Emergency Use Authorization (EUA). This EUA will remain  in effect (meaning this test can be used) for the duration of the COVID-19 declaration under Se ction 564(b)(1) of the Act, 21 U.S.C. section 360bbb-3(b)(1), unless the authorization is terminated or revoked sooner.  Performed at [facility], [place] [address], [place], [place] [address]     No results found.   Pertinent items are noted in HPI.  There were no vitals taken for this visit.  General appearance: alert, cooperative and appears stated age Head: Normocephalic, without obvious abnormality Neck: no JVD Resp: clear to auscultation bilaterally Cardio: regular rate and rhythm, S1, S2 normal, no murmur, click, rub or gallop GI: soft, non-tender; bowel sounds normal; no masses,  no organomegaly Extremities: mass idex right Pulses: 2+ and symmetric Skin: Skin color, texture, turgor normal. No rashes or lesions Neurologic: Grossly normal Incision/Wound: na  Assessment/Plan  Assessment:  1. 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 Would recommend excision of the cyst debridement distal phalangeal joint left index finger. Preperi-and postoperative course are discussed along with risk and complications. He is aware there is no guarantee to the surgery possibility of infection recurrence injury to arteries nerves tendons complete relief symptoms dystrophy the possibility of recurrence of the cyst. Like to proceed and this will be scheduled as an outpatient under regional anesthesia.  Follow up: Return for POST-OP    Daryll Brod 12/18/2019, 6:31 AM

## 2019-12-18 NOTE — Anesthesia Procedure Notes (Signed)
Procedure Name: MAC Date/Time: 12/18/2019 1:48 PM Performed by: Orlie Dakin, CRNA Pre-anesthesia Checklist: Patient identified, Emergency Drugs available, Suction available and Patient being monitored Patient Re-evaluated:Patient Re-evaluated prior to induction Oxygen Delivery Method: Simple face mask Preoxygenation: Pre-oxygenation with 100% oxygen Induction Type: IV induction Placement Confirmation: positive ETCO2

## 2019-12-18 NOTE — Transfer of Care (Signed)
Immediate Anesthesia Transfer of Care Note  Patient: Tyler Horne  Procedure(s) Performed: EXCISION CYSTIC MASS WITH DISTAL INTERPHANLEGEAL JOINT ARTHROTOMY RIGHT INDEX FINGER (Right Index Finger)  Patient Location: PACU  Anesthesia Type:MAC and Regional  Level of Consciousness: awake and patient cooperative  Airway & Oxygen Therapy: Patient Spontanous Breathing and Patient connected to nasal cannula oxygen  Post-op Assessment: Report given to RN and Post -op Vital signs reviewed and stable  Post vital signs: Reviewed and stable  Last Vitals:  Vitals Value Taken Time  BP    Temp    Pulse 70 12/18/19 1431  Resp 14 12/18/19 1431  SpO2 100 % 12/18/19 1431  Vitals shown include unvalidated device data.  Last Pain:  Vitals:   12/18/19 1125  TempSrc:   PainSc: 0-No pain         Complications: No complications documented.

## 2019-12-18 NOTE — Op Note (Signed)
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   ASSISTANT: none   ANESTHESIA:  Bier block with sedation and Local   INTRAVENOUS FLUIDS:  Per anesthesia flow sheet.   ESTIMATED BLOOD LOSS:  Minimal.   COMPLICATIONS:  None.   SPECIMENS:   Osteophytes foreign body and mass   TOURNIQUET TIME:    Total Tourniquet Time Documented: Forearm (Right) - 27 minutes Total: Forearm (Right) - 27 minutes    DISPOSITION:  Stable to PACU.   INDICATIONS: Patient is a 79 year old male with a history of mass on the volar radial aspect of the distal phalangeal joint of his right index finger.  This has been ultrasound revealing a mass present with a cystic mass measuring approximately 1.4 cm with apparent entrance into the distal phalangeal joint he is desirous having this removed.  Preperi-and postoperative course but discussed along with risks and complications.  He is aware that there is no guarantee to the surgery the possibility of infection recurrence injury to arteries nerves tendons complete relief symptoms just possibility of stiffness to the DIP joint in preoperative area the patient is seen extremity marked by both patient and surgeon antibiotic given OPERATIVE COURSE: Patient is brought to the operating room where a forearm-based IV regional anesthetic was carried out without difficulty under the direction the anesthesia department.  Was placed in the supine position prepped with ChloraPrep a 3-minute dry time was allowed and timeout taken to confirm patient procedure.  A  metacarpal block was given quarter percent bupivacaine without epinephrine 9 cc was used.  A mid lateral incision was then made brought across the top of the distal phalangeal joint of the right index finger carried down through subcutaneous tissue.  A multilobulated mass was immediately encountered.  Neurovascular structures were identified protected and the mass was removed in toto and sent to pathology.  The dissection was carried dorsally.  The extensor tendon was identified and incision made on the radial aspect the joint opened a synovectomy performed along with removal of osteophytes from the middle phalanx using a synovectomy rondure and house curette.  Specimen was sent to pathology a foreign body was noted after removal of the mass.  This appeared to be a small piece of metal or wood.  This was also removed and sent.  The wound was copious irrigated with saline.  The skin was then closed erupted 5-0 nylon sutures.  A sterile compressive dressing and splint to the digit applied.  Deflation of the tourniquet remaining fingers pink.  He was taken to the recovery room for observation in satisfactory condition.  He will be discharged home to return to the hand center [facility] in 1 week on Tylenol ibuprofen for pain he has tramadol for breakthrough.   Daryll Brod, MD Electronically signed, 12/18/19

## 2019-12-18 NOTE — Anesthesia Preprocedure Evaluation (Addendum)
Anesthesia Evaluation  Patient identified by MRN, date of birth, ID band Patient awake    Reviewed: Allergy & Precautions, NPO status , Patient's Chart, lab work & pertinent test results  Airway Mallampati: I       Dental no notable dental hx. (+) Edentulous Upper, Edentulous Lower   Pulmonary asthma , COPD,  oxygen dependent, former smoker,    Pulmonary exam normal breath sounds clear to auscultation       Cardiovascular hypertension, Normal cardiovascular exam Rhythm:Regular Rate:Normal     Neuro/Psych negative neurological ROS  negative psych ROS   GI/Hepatic GERD  ,  Endo/Other    Renal/GU      Musculoskeletal   Abdominal Normal abdominal exam  (+)   Peds  Hematology  (+) Blood dyscrasia, anemia ,   Anesthesia Other Findings PFT from 2018 reviewed  Reproductive/Obstetrics                            Anesthesia Physical  Anesthesia Plan  ASA: III  Anesthesia Plan: Bier Block and MAC and Bier Block-LIDOCAINE ONLY   Post-op Pain Management:    Induction:   PONV Risk Score and Plan: Propofol infusion  Airway Management Planned: Natural Airway and Mask  Additional Equipment: None  Intra-op Plan:   Post-operative Plan:   Informed Consent: I have reviewed the patients History and Physical, chart, labs and discussed the procedure including the risks, benefits and alternatives for the proposed anesthesia with the patient or authorized representative who has indicated his/her understanding and acceptance.       Plan Discussed with: CRNA and Anesthesiologist  Anesthesia Plan Comments:         Anesthesia Quick Evaluation

## 2019-12-18 NOTE — Anesthesia Postprocedure Evaluation (Signed)
Anesthesia Post Note  Patient: Tyler Horne  Procedure(s) Performed: EXCISION CYSTIC MASS WITH DISTAL INTERPHANLEGEAL JOINT ARTHROTOMY RIGHT INDEX FINGER (Right Index Finger)     Patient location during evaluation: PACU Anesthesia Type: MAC Level of consciousness: awake and alert Pain management: pain level controlled Vital Signs Assessment: post-procedure vital signs reviewed and stable Respiratory status: spontaneous breathing, nonlabored ventilation, respiratory function stable and patient connected to nasal cannula oxygen Cardiovascular status: stable and blood pressure returned to baseline Postop Assessment: no apparent nausea or vomiting Anesthetic complications: no   No complications documented.  Last Vitals:  Vitals:   12/18/19 1432 12/18/19 1445  BP: 122/73 121/77  Pulse: 70 71  Resp: 14 15  Temp: 36.6 C 36.6 C  SpO2: 100% 92%    Last Pain:  Vitals:   12/18/19 1445  TempSrc:   PainSc: 0-No pain                 Kiyara Bouffard

## 2019-12-19 ENCOUNTER — Encounter (HOSPITAL_COMMUNITY): Payer: Self-pay | Admitting: Orthopedic Surgery

## 2019-12-19 DIAGNOSIS — J209 Acute bronchitis, unspecified: Secondary | ICD-10-CM | POA: Diagnosis not present

## 2019-12-21 DIAGNOSIS — R062 Wheezing: Secondary | ICD-10-CM | POA: Diagnosis not present

## 2019-12-21 DIAGNOSIS — D485 Neoplasm of uncertain behavior of skin: Secondary | ICD-10-CM | POA: Diagnosis not present

## 2019-12-21 DIAGNOSIS — J449 Chronic obstructive pulmonary disease, unspecified: Secondary | ICD-10-CM | POA: Diagnosis not present

## 2019-12-21 LAB — SURGICAL PATHOLOGY

## 2020-01-13 ENCOUNTER — Other Ambulatory Visit: Payer: Self-pay | Admitting: Hematology and Oncology

## 2020-01-13 ENCOUNTER — Ambulatory Visit: Payer: Medicare HMO | Admitting: Hematology and Oncology

## 2020-01-13 ENCOUNTER — Other Ambulatory Visit: Payer: Medicare HMO

## 2020-01-13 DIAGNOSIS — R69 Illness, unspecified: Secondary | ICD-10-CM | POA: Diagnosis not present

## 2020-01-13 DIAGNOSIS — D500 Iron deficiency anemia secondary to blood loss (chronic): Secondary | ICD-10-CM

## 2020-01-14 ENCOUNTER — Inpatient Hospital Stay: Payer: Medicare HMO | Admitting: Hematology and Oncology

## 2020-01-14 ENCOUNTER — Other Ambulatory Visit: Payer: Self-pay | Admitting: Hematology and Oncology

## 2020-01-14 ENCOUNTER — Encounter: Payer: Self-pay | Admitting: Hematology and Oncology

## 2020-01-14 ENCOUNTER — Other Ambulatory Visit: Payer: Self-pay

## 2020-01-14 ENCOUNTER — Inpatient Hospital Stay: Payer: Medicare HMO | Attending: Hematology and Oncology

## 2020-01-14 VITALS — BP 114/71 | HR 92 | Temp 98.1°F | Resp 18 | Ht 67.0 in | Wt 139.1 lb

## 2020-01-14 DIAGNOSIS — D464 Refractory anemia, unspecified: Secondary | ICD-10-CM | POA: Diagnosis not present

## 2020-01-14 DIAGNOSIS — D509 Iron deficiency anemia, unspecified: Secondary | ICD-10-CM | POA: Insufficient documentation

## 2020-01-14 DIAGNOSIS — K648 Other hemorrhoids: Secondary | ICD-10-CM | POA: Diagnosis not present

## 2020-01-14 DIAGNOSIS — Z86010 Personal history of colonic polyps: Secondary | ICD-10-CM | POA: Insufficient documentation

## 2020-01-14 DIAGNOSIS — D500 Iron deficiency anemia secondary to blood loss (chronic): Secondary | ICD-10-CM

## 2020-01-14 LAB — CBC WITH DIFFERENTIAL (CANCER CENTER ONLY)
Abs Immature Granulocytes: 0.02 10*3/uL (ref 0.00–0.07)
Basophils Absolute: 0.1 10*3/uL (ref 0.0–0.1)
Basophils Relative: 1 %
Eosinophils Absolute: 0.8 10*3/uL — ABNORMAL HIGH (ref 0.0–0.5)
Eosinophils Relative: 11 %
HCT: 36.5 % — ABNORMAL LOW (ref 39.0–52.0)
Hemoglobin: 11.8 g/dL — ABNORMAL LOW (ref 13.0–17.0)
Immature Granulocytes: 0 %
Lymphocytes Relative: 25 %
Lymphs Abs: 1.8 10*3/uL (ref 0.7–4.0)
MCH: 31 pg (ref 26.0–34.0)
MCHC: 32.3 g/dL (ref 30.0–36.0)
MCV: 95.8 fL (ref 80.0–100.0)
Monocytes Absolute: 0.6 10*3/uL (ref 0.1–1.0)
Monocytes Relative: 8 %
Neutro Abs: 4 10*3/uL (ref 1.7–7.7)
Neutrophils Relative %: 55 %
Platelet Count: 231 10*3/uL (ref 150–400)
RBC: 3.81 MIL/uL — ABNORMAL LOW (ref 4.22–5.81)
RDW: 12.7 % (ref 11.5–15.5)
WBC Count: 7.3 10*3/uL (ref 4.0–10.5)
nRBC: 0 % (ref 0.0–0.2)

## 2020-01-14 LAB — CMP (CANCER CENTER ONLY)
ALT: 11 U/L (ref 0–44)
AST: 20 U/L (ref 15–41)
Albumin: 4.1 g/dL (ref 3.5–5.0)
Alkaline Phosphatase: 60 U/L (ref 38–126)
Anion gap: 8 (ref 5–15)
BUN: 25 mg/dL — ABNORMAL HIGH (ref 8–23)
CO2: 26 mmol/L (ref 22–32)
Calcium: 10.1 mg/dL (ref 8.9–10.3)
Chloride: 104 mmol/L (ref 98–111)
Creatinine: 1.79 mg/dL — ABNORMAL HIGH (ref 0.61–1.24)
GFR, Est AFR Am: 41 mL/min — ABNORMAL LOW (ref >60)
GFR, Estimated: 35 mL/min — ABNORMAL LOW (ref >60)
Glucose, Bld: 114 mg/dL — ABNORMAL HIGH (ref 70–99)
Potassium: 4.3 mmol/L (ref 3.5–5.1)
Sodium: 138 mmol/L (ref 135–145)
Total Bilirubin: 0.2 mg/dL — ABNORMAL LOW (ref 0.3–1.2)
Total Protein: 6.7 g/dL (ref 6.5–8.1)

## 2020-01-14 LAB — RETIC PANEL
Immature Retic Fract: 6.4 % (ref 2.3–15.9)
RBC.: 3.8 MIL/uL — ABNORMAL LOW (ref 4.22–5.81)
Retic Count, Absolute: 50.9 10*3/uL (ref 19.0–186.0)
Retic Ct Pct: 1.3 % (ref 0.4–3.1)
Reticulocyte Hemoglobin: 36.6 pg (ref >27.9)

## 2020-01-14 NOTE — Progress Notes (Signed)
[place] [phone]   [phone]  PROGRESS NOTE  Patient Care Team: Lorene Dy, MD as PCP - General (Internal Medicine)  Hematological/Oncological History #Normocytic Anemia #Iron Deficiency Anemia 1) Establish care with Dr. Lorenso Courier on 03/27/19 due to Hgb drop 14-12.2 per outside records 2) 03/27/2019: Hgb 11.5, WBC 6.6, Plt 301. Iron 70, TIBC 429, Sat 16% ferritin 16 3) 04/24/2019: Hgb 11.5, WBC 6.0, Plt 298. Iron 71, TIBC 369, Sat 19%, ferritin 15 4) 06/18/2019: EGD and colonoscopy performed. Revealed internal hemorrhoids and 3 mm polyp as well as dark stool throughout the colon.  5)  06/25/2019: Hgb 11.6, WBC 7.1, Plt 272 . Iron 57, TIBC 398, Sat 14%, Ferritin 22 6) 11/2019: received IV ferrous sucrose 237m x 5 doses 7) 01/14/2020: WBC 7.3, Hgb 11.8, Plt 231, Iron/Ferritin pending.   Interval History:  EAlhaji Mcneal89y.o. male with medical history significant for iron deficiency anemia presents for a follow up visit. He was last seen on 09/23/2018. In the interim since his last visit he has had no hospitalizations or ED visits, but has had an operation on his finger on 12/18/2019.   On exam today Mr. HSheehanis accompanied by his brother.  He notes he did not feel any difference after infusion of the IV iron.  He also notes he has been continuing to take his p.o. iron pills without much improvement in his shortness of breath or baseline level of energy.  He notes that his appetite has been good and has had no other medical issues in the interim since our last visit.  He denies any overt signs of bleeding such as nosebleeds, dark stools, or blood in his stool.  He reports that [facility]".  He otherwise has no questions, concerns or complaints today.  A full 10 point ROS is listed below.  MEDICAL HISTORY:  Past Medical History:  Diagnosis Date  . Anemia   . Arthritis    DDD, shoulder  . Asthma     hayfever, seasonal allergies, + smoker, uses primatene pill on occas.   .Marland KitchenCOPD (chronic obstructive pulmonary disease) ([place]   . GERD (gastroesophageal reflux disease)    uses Rolaids on rare occas.   . Mass of finger    cystic mass right index finger  . Postnasal drip    uses Benadryl & primatene as needed   . Supplemental oxygen dependent    continuous  . Wears dentures     SURGICAL HISTORY: Past Surgical History:  Procedure Laterality Date  . BIOPSY  06/18/2019   Procedure: BIOPSY;  Surgeon: MRush LandmarkGTelford Nab, MD;  [place] [facility]  Service: Gastroenterology;;  . COLONOSCOPY WITH PROPOFOL N/A 06/18/2019   Procedure: COLONOSCOPY WITH PROPOFOL;  Surgeon: MIrving Copas, MD;  [facility]  Service: Gastroenterology;  Laterality: N/A;  . CYST EXCISION Right 12/18/2019   Procedure: EXCISION CYSTIC MASS WITH DISTAL INTERPHANLEGEAL JOINT ARTHROTOMY RIGHT INDEX FINGER;  Surgeon: KDaryll Brod MD;  [place] [facility]  Service: Orthopedics;  Laterality: Right;  IV REGIONAL FOREARM BLOCK  . ESOPHAGOGASTRODUODENOSCOPY (EGD) WITH PROPOFOL N/A 06/18/2019   Procedure: ESOPHAGOGASTRODUODENOSCOPY (EGD) WITH PROPOFOL;  Surgeon: MRush LandmarkGTelford Nab, MD;  [place]  Service: Gastroenterology;  Laterality: N/A;  . GANGLION CYST EXCISION Left    L hand   . LUMBAR LAMINECTOMY/DECOMPRESSION MICRODISCECTOMY Left 10/02/2012   Procedure: LUMBAR LAMINECTOMY/DECOMPRESSION MICRODISCECTOMY 2 LEVELS;  Surgeon: JOphelia Charter MD;  Location:  [facility];  Service: Neurosurgery;  Laterality: Left;  Left Lumbar four-five Lumbar five sacral one diskectomy  . PILONIDAL CYST EXCISION    . POLYPECTOMY  06/18/2019   Procedure: POLYPECTOMY;  Surgeon: Mansouraty, Telford Nab., MD;  Location: [facility] ENDOSCOPY;  Service: Gastroenterology;;  . SHOULDER ARTHROSCOPY  2008   Left  . TONSILLECTOMY      ALLERGIES:  has No Known Allergies.  MEDICATIONS:  Current Outpatient Medications   Medication Sig Dispense Refill  . albuterol (VENTOLIN HFA) 108 (90 Base) MCG/ACT inhaler INHALE 2 PUFFS INTO THE LUNGS EVERY 4 HOURS AS NEEDED FOR WHEEZE OR SHORTNESS OF BREATH. (Patient taking differently: Inhale 2 puffs into the lungs every 4 (four) hours as needed for wheezing or shortness of breath. ) 18 Inhaler 2  . ALPRAZolam (XANAX) 0.25 MG tablet Take 0.25 mg by mouth 2 (two) times daily as needed for anxiety.    . Ascorbic Acid (VITAMIN C PO) Take 1 tablet by mouth daily.    . Calcium-Magnesium-Zinc (CAL-MAG-ZINC PO) Take 1 tablet by mouth daily.    . Cholecalciferol (VITAMIN D3 PO) Take 1 capsule by mouth daily.    . Cyanocobalamin (B-12 PO) Place 1 tablet under the tongue daily.     [name] dextromethorphan-guaiFENesin (MUCINEX DM) 30-600 MG 12hr tablet Take 1 tablet by mouth daily as needed for cough.    . diphenhydrAMINE (BENADRYL) 25 mg capsule Take 25-50 mg by mouth every 8 (eight) hours as needed for allergies or sleep.     [name] docusate sodium 100 MG CAPS Take 100 mg by mouth 2 (two) times daily. 60 capsule 1  . Ensure Plus (ENSURE PLUS) LIQD Take 237 mLs by mouth daily.     [name] Ephedrine-Guaifenesin (PRIMATENE ASTHMA) 12.5-200 MG TABS Take 0.25 tablets by mouth daily as needed (for allergies).    . ferrous sulfate 325 (65 FE) MG EC tablet TAKE 1 TABLET BY MOUTH DAILY WITH BREAKFAST. TAKE WITH ORANGE JUICE OF VITAMIN C TO HELP ABSORPTION. (Patient taking differently: Take 325 mg by mouth daily with breakfast. ) 90 tablet 1  . ipratropium-albuterol (DUONEB) 0.5-2.5 (3) MG/3ML SOLN USE 3 MLS BY NEBULIZATION 4 (FOUR) TIMES DAILY. (Patient taking differently: Inhale 3 mLs into the lungs every 4 (four) hours as needed (shortness of breath). ) 360 mL 5  . meclizine (ANTIVERT) 25 MG tablet Take 25 mg by mouth 3 (three) times daily as needed for dizziness.    [name] omeprazole (PRILOSEC) 20 MG capsule Take 1 capsule (20 mg total) by mouth as directed. 20 mg twice daily for 4 weeks then decrease to 20 mg  daily. (Patient taking differently: Take 20 mg by mouth 2 (two) times daily before a meal. ) 90 capsule 3  . OXYGEN Inhale 2 L/min into the lungs continuous.    [name] PRESCRIPTION MEDICATION Inject into the skin. Iron infusion/ Dr. Lorenso Courier    . sennosides-docusate sodium (SENOKOT-S) 8.6-50 MG tablet Take 2 tablets by mouth daily as needed for constipation.     . sodium chloride (OCEAN) 0.65 % nasal spray Place 1 spray into the nose daily as needed for congestion.     . tamsulosin (FLOMAX) 0.4 MG CAPS capsule Take 0.4 mg by mouth daily.    . traMADol (ULTRAM) 50 MG tablet Take 50 mg by mouth every 6 (six) hours as needed for moderate pain.    [name] triamcinolone cream (KENALOG) 0.1 % Apply 1 application topically 2 (two) times daily.      No current facility-administered  medications for this visit.    REVIEW OF SYSTEMS:   Constitutional: ( - ) fevers, ( - )  chills , ( - ) night sweats Eyes: ( - ) blurriness of vision, ( - ) double vision, ( - ) watery eyes Ears, nose, mouth, throat, and face: ( - ) mucositis, ( - ) sore throat Respiratory: ( + ) cough, ( + ) dyspnea, ( + ) wheezes Cardiovascular: ( - ) palpitation, ( - ) chest discomfort, ( - ) lower extremity swelling Gastrointestinal:  ( - ) nausea, ( - ) heartburn, ( - ) change in bowel habits Skin: ( - ) abnormal skin rashes Lymphatics: ( - ) new lymphadenopathy, ( - ) easy bruising Neurological: ( - ) numbness, ( - ) tingling, ( - ) new weaknesses Behavioral/Psych: ( - ) mood change, ( - ) new changes  All other systems were reviewed with the patient and are negative.  PHYSICAL EXAMINATION: ECOG PERFORMANCE STATUS: 3 - Symptomatic, >50% confined to bed  Vitals:   01/14/20 1508  BP: 114/71  Pulse: 92  Resp: 18  Temp: 98.1 F (36.7 C)  SpO2: 93%   Filed Weights   01/14/20 1508  Weight: 139 lb 1.6 oz (63.1 kg)    GENERAL: chronically ill appearing elderly Caucasian male in no distress and comfortable. In a wheelchair SKIN: skin  color, texture, turgor are normal, no rashes or significant lesions EYES: conjunctiva are pink and non-injected, sclera clear LUNGS: clear to auscultation and percussion with normal breathing effort HEART: regular rate & rhythm and no murmurs and no lower extremity edema Musculoskeletal: no cyanosis of digits and no clubbing  PSYCH: alert & oriented x 3, fluent speech NEURO: no focal motor/sensory deficits  LABORATORY DATA:  I have reviewed the data as listed Recent Results (from the past 2160 hour(s))  SARS CORONAVIRUS 2 (TAT 6-24 HRS) Nasopharyngeal Nasopharyngeal Swab     Status: None   Collection Time: 12/16/19  2:14 PM   Specimen: Nasopharyngeal Swab  Result Value Ref Range   SARS Coronavirus 2 NEGATIVE NEGATIVE    Comment: (NOTE) SARS-CoV-2 target nucleic acids are NOT DETECTED.  The SARS-CoV-2 RNA is generally detectable in upper and lower respiratory specimens during the acute phase of infection. Negative results do not preclude SARS-CoV-2 infection, do not rule out co-infections with other pathogens, and should not be used as the sole basis for treatment or other patient management decisions. Negative results must be combined with clinical observations, patient history, and epidemiological information. The expected result is Negative.  Fact Sheet for Patients: SugarRoll.be  Fact Sheet for Healthcare Providers: https://www.woods-mathews.com/  This test is not yet approved or cleared by the [place] FDA and  has been authorized for detection and/or diagnosis of SARS-CoV-2 by FDA under an Emergency Use Authorization (EUA). This EUA will remain  in effect (meaning this test can be used) for the duration of the COVID-19 declaration under Se ction 564(b)(1) of the Act, 21 U.S.C. section 360bbb-3(b)(1), unless the authorization is terminated or revoked sooner.  Performed at [facility], [place] [address] [place],  [place] [address]   CBC per protocol     Status: Abnormal   Collection Time: 12/18/19 11:55 AM  Result Value Ref Range   WBC 5.7 4.0 - 10.5 K/uL   RBC 3.89 (L) 4.22 - 5.81 MIL/uL   Hemoglobin 12.1 (L) 13.0 - 17.0 g/dL   HCT 37.8 (L) 39 - 52 %   MCV 97.2 80.0 -  100.0 fL   MCH 31.1 26.0 - 34.0 pg   MCHC 32.0 30.0 - 36.0 g/dL   RDW 13.0 11.5 - 15.5 %   Platelets 245 150 - 400 K/uL   nRBC 0.0 0.0 - 0.2 %    Comment: Performed at [facility], [place] [address], [place], [place] [address]  Surgical pathology     Status: None   Collection Time: 12/18/19  2:12 PM  Result Value Ref Range   SURGICAL PATHOLOGY      SURGICAL PATHOLOGY CASE: [id number] PATIENT: Laurene Footman Surgical Pathology Report     Clinical History: cystic mass right index finger (cm)     FINAL MICROSCOPIC DIAGNOSIS:  A. 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  The specimen is entirely submitted in 1 cassette. Craig Staggers 12/21/2019)    Final Diagnosis performed by Jaquita Folds, MD.   Electronically signed 12/21/2019 Technical component performed at [employer]. [facility], [place] [address] [place], [place] [address].  Professional component performed at [facility], [place] [address] [place], [place] [address].  Immunohistochemistry Technical component (if applicable) was performed at [facility]. [address] [place],  [place], [place] [address].   IMMUNOHISTOCHEMISTRY DISCLAIMER (if applicable): Some of these immunohistochemical stains may have been developed and the performance characteristics determine by [facility]. Some may not have been cleared or approved by the U.S. Food and Drug Administration. The FDA has  determined that such clearance or approval is not necessary. This test is used for clinical purposes. It should not be regarded as investigational or for research. This laboratory is certified under the [place] (CLIA-88) as qualified to perform high complexity clinical laboratory testing.  The controls stained appropriately.   Retic Panel     Status: Abnormal   Collection Time: 01/14/20  2:39 PM  Result Value Ref Range   Retic Ct Pct 1.3 0.4 - 3.1 %   RBC. 3.80 (L) 4.22 - 5.81 MIL/uL   Retic Count, Absolute 50.9 19.0 - 186.0 K/uL   Immature Retic Fract 6.4 2.3 - 15.9 %   Reticulocyte Hemoglobin 36.6 >27.9 pg    Comment:        Given the high negative predictive value of a RET-He result > 32 pg iron deficiency is essentially excluded. If this patient is anemic other etiologies should be considered. Performed at [facility], [place] [address], [place], [place] [address]   CMP ([place] only)     Status: Abnormal   Collection Time: 01/14/20  2:39 PM  Result Value Ref Range   Sodium 138 135 - 145 mmol/L   Potassium 4.3 3.5 - 5.1 mmol/L   Chloride 104 98 - 111 mmol/L   CO2 26 22 - 32 mmol/L   Glucose, Bld 114 (H) 70 - 99 mg/dL    Comment: Glucose reference range applies only to samples taken after fasting for at least 8 hours.   BUN 25 (H) 8 - 23 mg/dL   Creatinine 1.79 (H) 0.61 - 1.24 mg/dL   Calcium 10.1 8.9 - 10.3 mg/dL   Total Protein 6.7 6.5 - 8.1 g/dL   Albumin 4.1 3.5 - 5.0 g/dL   AST 20  15 - 41 U/L   ALT 11 0 - 44 U/L   Alkaline Phosphatase 60 38 - 126 U/L   Total Bilirubin 0.2 (L) 0.3 - 1.2 mg/dL   GFR, Est Non Af Am 35 (L) >60 mL/min   GFR, Est AFR Am 41 (L) >60 mL/min   Anion gap 8 5 - 15    Comment: Performed at [facility], [place] [address], [place], [place] [address]  CBC with Differential ([place] Only)     Status: Abnormal   Collection Time: 01/14/20  2:39 PM   Result Value Ref Range   WBC Count 7.3 4.0 - 10.5 K/uL   RBC 3.81 (L) 4.22 - 5.81 MIL/uL   Hemoglobin 11.8 (L) 13.0 - 17.0 g/dL   HCT 36.5 (L) 39 - 52 %   MCV 95.8 80.0 - 100.0 fL   MCH 31.0 26.0 - 34.0 pg   MCHC 32.3 30.0 - 36.0 g/dL   RDW 12.7 11.5 - 15.5 %   Platelet Count 231 150 - 400 K/uL   nRBC 0.0 0.0 - 0.2 %   Neutrophils Relative % 55 %   Neutro Abs 4.0 1.7 - 7.7 K/uL   Lymphocytes Relative 25 %   Lymphs Abs 1.8 0.7 - 4.0 K/uL   Monocytes Relative 8 %   Monocytes Absolute 0.6 0 - 1 K/uL   Eosinophils Relative 11 %   Eosinophils Absolute 0.8 (H) 0 - 0 K/uL   Basophils Relative 1 %   Basophils Absolute 0.1 0 - 0 K/uL   Immature Granulocytes 0 %   Abs Immature Granulocytes 0.02 0.00 - 0.07 K/uL    Comment: Performed at [facility], [place] [address] [place],  [address]    RADIOGRAPHIC STUDIES: No relevant radiographic studies.  ASSESSMENT & PLAN Tyler Horne 79 y.o. male with medical history significant for COPD on home O2, GERD, and postnasal drip who presents for f/u of a normocytic anemia and iron deficiency.   Labs today are consistent with continued iron deficiency anemia.  On his initial visit he was recommended to start on iron 325 mg p.o. daily with a source of vitamin C. given the relative stability of his iron levels and hemoglobin I am concerned that the patient is not necessarily taking his iron as prescribed (his clinic visits are often tangential and difficult to focus, unclear if some component of cognitive decline).   Fortunately the patient has undergone GI evaluation with endoscopy and colonoscopy.  Colonoscopy was significant for internal hemorrhoids, but no overt source of bleeding was discovered.  Upper endoscopy revealed no clear sources of bleeding as well.  It was noted that if the patient continues to be iron deficient capsule endoscopy could be considered.  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  --work up for alternative etiologies of his anemia were negative, included B12, folate, and TSH. May have some component of low Hgb 2/2 to renal dysfunction.  --patient has undergone evaluation with EGD and colonoscopy in Jan 2021 which showed internal hemorrhoids and 3 mm polyp, but no other overt sources of bleeding.  --continue iron pills 321m PO daily with a source of vitamin C. Re-emphasized the proper way to take these pills today.  --completed iron sucrose x 5 doses in June 2021 -- no clear indication for a bone marrow biopsy at this time.  --RTC in 6 months to  continue monitoring  All questions were answered. The patient knows to call the clinic with any problems, questions or concerns.  A total of more than 30 minutes were spent face-to-face with the patient during this encounter and over half of that time was spent on counseling and coordination of care as outlined above.   Ledell Peoples, MD Department of Hematology/Oncology [employer] at [facility] Phone: [phone] Pager: [phone] Email: [email or web address]  01/14/2020 5:34 PM

## 2020-01-15 ENCOUNTER — Telehealth: Payer: Self-pay | Admitting: *Deleted

## 2020-01-15 ENCOUNTER — Telehealth: Payer: Self-pay | Admitting: Hematology and Oncology

## 2020-01-15 DIAGNOSIS — J209 Acute bronchitis, unspecified: Secondary | ICD-10-CM | POA: Diagnosis not present

## 2020-01-15 LAB — IRON AND TIBC
Iron: 87 ug/dL (ref 42–163)
Saturation Ratios: 28 % (ref 20–55)
TIBC: 311 ug/dL (ref 202–409)
UIBC: 225 ug/dL (ref 117–376)

## 2020-01-15 LAB — FERRITIN: Ferritin: 290 ng/mL (ref 24–336)

## 2020-01-15 NOTE — Telephone Encounter (Signed)
Scheduled per los. Called and spoke with patients brother, confirmed appt

## 2020-01-15 NOTE — Telephone Encounter (Signed)
-----   Message from Orson Slick, MD sent at 01/15/2020  9:33 AM EDT ----- Please let Mr. Kataoka know that his iron levels are fully replete. I suspect that his mild anemia is due to his mild renal insufficiency. We will see him back in 6 months time to reassess. ----- Message ----- From: Buel Ream, [facility] [place] Sent: 01/14/2020   2:55 PM EDT To: Orson Slick, MD

## 2020-01-15 NOTE — Telephone Encounter (Signed)
TCT pt's brother, Shanon Brow regarding lab results from this week.  Spoke with him and advised that Stan's iron levels came up nicely with the IV iron.  Dr. Lorenso Courier believes that his mild anemia is due to his renal insufficiency.  Advised that we will see him back I 6 months. Provided date and time of next appt. Shanon Brow voiced understanding.

## 2020-01-21 DIAGNOSIS — R062 Wheezing: Secondary | ICD-10-CM | POA: Diagnosis not present

## 2020-01-21 DIAGNOSIS — J449 Chronic obstructive pulmonary disease, unspecified: Secondary | ICD-10-CM | POA: Diagnosis not present

## 2020-02-13 DIAGNOSIS — R69 Illness, unspecified: Secondary | ICD-10-CM | POA: Diagnosis not present

## 2020-02-17 DIAGNOSIS — I10 Essential (primary) hypertension: Secondary | ICD-10-CM | POA: Diagnosis not present

## 2020-02-17 DIAGNOSIS — Z131 Encounter for screening for diabetes mellitus: Secondary | ICD-10-CM | POA: Diagnosis not present

## 2020-02-17 DIAGNOSIS — R5383 Other fatigue: Secondary | ICD-10-CM | POA: Diagnosis not present

## 2020-02-17 DIAGNOSIS — Z125 Encounter for screening for malignant neoplasm of prostate: Secondary | ICD-10-CM | POA: Diagnosis not present

## 2020-02-17 DIAGNOSIS — E039 Hypothyroidism, unspecified: Secondary | ICD-10-CM | POA: Diagnosis not present

## 2020-02-17 DIAGNOSIS — E7800 Pure hypercholesterolemia, unspecified: Secondary | ICD-10-CM | POA: Diagnosis not present

## 2020-02-18 DIAGNOSIS — R69 Illness, unspecified: Secondary | ICD-10-CM | POA: Diagnosis not present

## 2020-02-18 DIAGNOSIS — E785 Hyperlipidemia, unspecified: Secondary | ICD-10-CM | POA: Diagnosis not present

## 2020-02-21 DIAGNOSIS — J449 Chronic obstructive pulmonary disease, unspecified: Secondary | ICD-10-CM | POA: Diagnosis not present

## 2020-02-21 DIAGNOSIS — R062 Wheezing: Secondary | ICD-10-CM | POA: Diagnosis not present

## 2020-03-14 DIAGNOSIS — R69 Illness, unspecified: Secondary | ICD-10-CM | POA: Diagnosis not present

## 2020-03-22 DIAGNOSIS — J449 Chronic obstructive pulmonary disease, unspecified: Secondary | ICD-10-CM | POA: Diagnosis not present

## 2020-03-22 DIAGNOSIS — R062 Wheezing: Secondary | ICD-10-CM | POA: Diagnosis not present

## 2020-03-31 ENCOUNTER — Other Ambulatory Visit: Payer: Self-pay | Admitting: Hematology and Oncology

## 2020-04-10 DIAGNOSIS — R69 Illness, unspecified: Secondary | ICD-10-CM | POA: Diagnosis not present

## 2020-04-22 DIAGNOSIS — J449 Chronic obstructive pulmonary disease, unspecified: Secondary | ICD-10-CM | POA: Diagnosis not present

## 2020-04-22 DIAGNOSIS — R062 Wheezing: Secondary | ICD-10-CM | POA: Diagnosis not present

## 2020-05-05 DIAGNOSIS — R69 Illness, unspecified: Secondary | ICD-10-CM | POA: Diagnosis not present

## 2020-05-18 DIAGNOSIS — J209 Acute bronchitis, unspecified: Secondary | ICD-10-CM | POA: Diagnosis not present

## 2020-05-18 DIAGNOSIS — Z23 Encounter for immunization: Secondary | ICD-10-CM | POA: Diagnosis not present

## 2020-05-20 ENCOUNTER — Telehealth: Payer: Self-pay | Admitting: Hematology and Oncology

## 2020-05-20 NOTE — Telephone Encounter (Signed)
R/s 2/10 appt per provider on call schedule. Called and left msg. Mailed printout

## 2020-05-22 DIAGNOSIS — R062 Wheezing: Secondary | ICD-10-CM | POA: Diagnosis not present

## 2020-05-22 DIAGNOSIS — J449 Chronic obstructive pulmonary disease, unspecified: Secondary | ICD-10-CM | POA: Diagnosis not present

## 2020-06-02 ENCOUNTER — Telehealth: Payer: Self-pay | Admitting: Emergency Medicine

## 2020-06-02 DIAGNOSIS — R69 Illness, unspecified: Secondary | ICD-10-CM | POA: Diagnosis not present

## 2020-06-02 MED ORDER — ALBUTEROL SULFATE HFA 108 (90 BASE) MCG/ACT IN AERS: 1.0000 | INHALATION_SPRAY | RESPIRATORY_TRACT | 3 refills | Status: DC | PRN

## 2020-06-02 MED ORDER — IPRATROPIUM-ALBUTEROL 0.5-2.5 (3) MG/3ML IN SOLN: RESPIRATORY_TRACT | 5 refills | Status: DC

## 2020-06-02 NOTE — Telephone Encounter (Signed)
Requests Ventolin rx refill  Sent to pharmacy per request  follow up as planned

## 2020-06-02 NOTE — Telephone Encounter (Signed)
Spoke with the pt't brother, Jena Gauss and have sent refills on Duoneb per pt request  Nothing further needed

## 2020-06-22 DIAGNOSIS — J449 Chronic obstructive pulmonary disease, unspecified: Secondary | ICD-10-CM | POA: Diagnosis not present

## 2020-06-22 DIAGNOSIS — R062 Wheezing: Secondary | ICD-10-CM | POA: Diagnosis not present

## 2020-07-14 ENCOUNTER — Other Ambulatory Visit: Payer: Medicare HMO

## 2020-07-14 ENCOUNTER — Ambulatory Visit: Payer: Medicare HMO | Admitting: Hematology and Oncology

## 2020-07-15 ENCOUNTER — Other Ambulatory Visit: Payer: Self-pay | Admitting: Hematology and Oncology

## 2020-07-15 ENCOUNTER — Inpatient Hospital Stay: Payer: Medicare HMO | Attending: Hematology and Oncology

## 2020-07-15 ENCOUNTER — Inpatient Hospital Stay: Payer: Medicare HMO | Admitting: Hematology and Oncology

## 2020-07-15 ENCOUNTER — Other Ambulatory Visit: Payer: Self-pay

## 2020-07-15 VITALS — BP 114/64 | HR 75 | Temp 98.0°F | Resp 20 | Ht 67.0 in | Wt 133.6 lb

## 2020-07-15 DIAGNOSIS — Z9981 Dependence on supplemental oxygen: Secondary | ICD-10-CM | POA: Insufficient documentation

## 2020-07-15 DIAGNOSIS — J449 Chronic obstructive pulmonary disease, unspecified: Secondary | ICD-10-CM | POA: Diagnosis not present

## 2020-07-15 DIAGNOSIS — D464 Refractory anemia, unspecified: Secondary | ICD-10-CM | POA: Diagnosis not present

## 2020-07-15 DIAGNOSIS — K219 Gastro-esophageal reflux disease without esophagitis: Secondary | ICD-10-CM | POA: Insufficient documentation

## 2020-07-15 DIAGNOSIS — D509 Iron deficiency anemia, unspecified: Secondary | ICD-10-CM

## 2020-07-15 DIAGNOSIS — D649 Anemia, unspecified: Secondary | ICD-10-CM

## 2020-07-15 LAB — RETIC PANEL
Immature Retic Fract: 4.6 % (ref 2.3–15.9)
RBC.: 3.73 MIL/uL — ABNORMAL LOW (ref 4.22–5.81)
Retic Count, Absolute: 45.9 10*3/uL (ref 19.0–186.0)
Retic Ct Pct: 1.2 % (ref 0.4–3.1)
Reticulocyte Hemoglobin: 35.1 pg (ref >27.9)

## 2020-07-15 LAB — CMP (CANCER CENTER ONLY)
ALT: 16 U/L (ref 0–44)
AST: 23 U/L (ref 15–41)
Albumin: 4.1 g/dL (ref 3.5–5.0)
Alkaline Phosphatase: 64 U/L (ref 38–126)
Anion gap: 7 (ref 5–15)
BUN: 21 mg/dL (ref 8–23)
CO2: 28 mmol/L (ref 22–32)
Calcium: 9.1 mg/dL (ref 8.9–10.3)
Chloride: 101 mmol/L (ref 98–111)
Creatinine: 1.47 mg/dL — ABNORMAL HIGH (ref 0.61–1.24)
GFR, Estimated: 48 mL/min — ABNORMAL LOW (ref >60)
Glucose, Bld: 112 mg/dL — ABNORMAL HIGH (ref 70–99)
Potassium: 3.6 mmol/L (ref 3.5–5.1)
Sodium: 136 mmol/L (ref 135–145)
Total Bilirubin: 0.5 mg/dL (ref 0.3–1.2)
Total Protein: 6.5 g/dL (ref 6.5–8.1)

## 2020-07-15 LAB — CBC WITH DIFFERENTIAL (CANCER CENTER ONLY)
Abs Immature Granulocytes: 0.01 10*3/uL (ref 0.00–0.07)
Basophils Absolute: 0 10*3/uL (ref 0.0–0.1)
Basophils Relative: 1 %
Eosinophils Absolute: 0.3 10*3/uL (ref 0.0–0.5)
Eosinophils Relative: 5 %
HCT: 35.8 % — ABNORMAL LOW (ref 39.0–52.0)
Hemoglobin: 11.8 g/dL — ABNORMAL LOW (ref 13.0–17.0)
Immature Granulocytes: 0 %
Lymphocytes Relative: 20 %
Lymphs Abs: 1.1 10*3/uL (ref 0.7–4.0)
MCH: 31.5 pg (ref 26.0–34.0)
MCHC: 33 g/dL (ref 30.0–36.0)
MCV: 95.5 fL (ref 80.0–100.0)
Monocytes Absolute: 0.6 10*3/uL (ref 0.1–1.0)
Monocytes Relative: 10 %
Neutro Abs: 3.7 10*3/uL (ref 1.7–7.7)
Neutrophils Relative %: 64 %
Platelet Count: 218 10*3/uL (ref 150–400)
RBC: 3.75 MIL/uL — ABNORMAL LOW (ref 4.22–5.81)
RDW: 11.9 % (ref 11.5–15.5)
WBC Count: 5.7 10*3/uL (ref 4.0–10.5)
nRBC: 0 % (ref 0.0–0.2)

## 2020-07-18 LAB — FERRITIN: Ferritin: 243 ng/mL (ref 24–336)

## 2020-07-18 LAB — IRON AND TIBC
Iron: 101 ug/dL (ref 42–163)
Saturation Ratios: 34 % (ref 20–55)
TIBC: 294 ug/dL (ref 202–409)
UIBC: 193 ug/dL (ref 117–376)

## 2020-07-19 ENCOUNTER — Encounter: Payer: Self-pay | Admitting: Hematology and Oncology

## 2020-07-19 NOTE — Progress Notes (Signed)
[facility] Telephone:([phone]   [phone]  PROGRESS NOTE  Patient Care Team: Lorene Dy, MD as PCP - General (Internal Medicine)  Hematological/Oncological History #Normocytic Anemia #Iron Deficiency Anemia 1) Establish care with Dr. Lorenso Courier on 03/27/19 due to Hgb drop 14-12.2 per outside records 2) 03/27/2019: Hgb 11.5, WBC 6.6, Plt 301. Iron 70, TIBC 429, Sat 16% ferritin 16 3) 04/24/2019: Hgb 11.5, WBC 6.0, Plt 298. Iron 71, TIBC 369, Sat 19%, ferritin 15 4) 06/18/2019: EGD and colonoscopy performed. Revealed internal hemorrhoids and 3 mm polyp as well as dark stool throughout the colon.  5)  06/25/2019: Hgb 11.6, WBC 7.1, Plt 272 . Iron 57, TIBC 398, Sat 14%, Ferritin 22 6) 11/2019: received IV ferrous sucrose 257m x 5 doses 7) 01/14/2020: WBC 7.3, Hgb 11.8, Plt 231, Iron/Ferritin pending.   Interval History:  Tyler Bodey73y.o. male with medical history significant for iron deficiency anemia presents for a follow up visit. He was last seen on 01/14/2020. In the interim since his last visit he has had no hospitalizations or ED visits.  On exam today Tyler Horne accompanied by his brother.  He notes he has been well in the interim since her last visit.  He continues on 2 L of oxygen at home and with activity.  He notes that his breathing is currently at baseline and he has not noticed any difference in his energy levels.  He continues take his p.o. iron therapy without any constipation or stomach upset.  Once again he denies any overt signs of bleeding, bruising, or dark stools.  A full 10 point ROS is listed below.  MEDICAL HISTORY:  Past Medical History:  Diagnosis Date  . Anemia   . Arthritis    DDD, shoulder  . Asthma    hayfever, seasonal allergies, + smoker, uses primatene pill on occas.   .Marland KitchenCOPD (chronic obstructive pulmonary disease) ([place]   . GERD (gastroesophageal reflux disease)    uses Rolaids on rare occas.   . Mass of finger     cystic mass right index finger  . Postnasal drip    uses Benadryl & primatene as needed   . Supplemental oxygen dependent    continuous  . Wears dentures     SURGICAL HISTORY: Past Surgical History:  Procedure Laterality Date  . BIOPSY  06/18/2019   Procedure: BIOPSY;  Surgeon: MRush LandmarkGTelford Nab, MD;  [facility]  Service: Gastroenterology;;  . COLONOSCOPY WITH PROPOFOL N/A 06/18/2019   Procedure: COLONOSCOPY WITH PROPOFOL;  Surgeon: MIrving Copas, MD;  [facility]  Service: Gastroenterology;  Laterality: N/A;  . CYST EXCISION Right 12/18/2019   Procedure: EXCISION CYSTIC MASS WITH DISTAL INTERPHANLEGEAL JOINT ARTHROTOMY RIGHT INDEX FINGER;  Surgeon: KDaryll Brod MD;  [place] [facility]  Service: Orthopedics;  Laterality: Right;  IV REGIONAL FOREARM BLOCK  . ESOPHAGOGASTRODUODENOSCOPY (EGD) WITH PROPOFOL N/A 06/18/2019   Procedure: ESOPHAGOGASTRODUODENOSCOPY (EGD) WITH PROPOFOL;  Surgeon: MRush LandmarkGTelford Nab, MD;  [place] [facility]  Service: Gastroenterology;  Laterality: N/A;  . GANGLION CYST EXCISION Left    L hand   . LUMBAR LAMINECTOMY/DECOMPRESSION MICRODISCECTOMY Left 10/02/2012   Procedure: LUMBAR LAMINECTOMY/DECOMPRESSION MICRODISCECTOMY 2 LEVELS;  Surgeon: JOphelia Charter MD;  [facility];  Service: Neurosurgery;  Laterality: Left;  Left Lumbar four-five Lumbar five sacral one diskectomy  . PILONIDAL CYST EXCISION    . POLYPECTOMY  06/18/2019   Procedure: POLYPECTOMY;  Surgeon: Mansouraty, GTelford Nab, MD;  [facility];  Service: Gastroenterology;;  . SHOULDER ARTHROSCOPY  2008   Left  . TONSILLECTOMY      ALLERGIES:  has No Known Allergies.  MEDICATIONS:  Current Outpatient Medications  Medication Sig Dispense Refill  . albuterol (VENTOLIN HFA) 108 (90 Base) MCG/ACT inhaler Inhale 1-2 puffs into the lungs every 4 (four) hours as needed for wheezing or shortness of breath. 1 each 3  . ALPRAZolam (XANAX)  0.25 MG tablet Take 0.25 mg by mouth 2 (two) times daily as needed for anxiety. (Patient not taking: Reported on 07/15/2020)    . Ascorbic Acid (VITAMIN C PO) Take 1 tablet by mouth daily.    . Calcium-Magnesium-Zinc (CAL-MAG-ZINC PO) Take 1 tablet by mouth daily.    . Cholecalciferol (VITAMIN D3 PO) Take 1 capsule by mouth daily.    . Cyanocobalamin (B-12 PO) Place 1 tablet under the tongue daily.     [name] dextromethorphan-guaiFENesin (MUCINEX DM) 30-600 MG 12hr tablet Take 1 tablet by mouth daily as needed for cough.    . diphenhydrAMINE (BENADRYL) 25 mg capsule Take 25-50 mg by mouth every 8 (eight) hours as needed for allergies or sleep.     [name] docusate sodium 100 MG CAPS Take 100 mg by mouth 2 (two) times daily. 60 capsule 1  . Ensure Plus (ENSURE PLUS) LIQD Take 237 mLs by mouth daily.     [name] Ephedrine-Guaifenesin (PRIMATENE ASTHMA) 12.5-200 MG TABS Take 0.25 tablets by mouth daily as needed (for allergies).    . ferrous sulfate 325 (65 FE) MG EC tablet TAKE 1 TABLET BY MOUTH DAILY WITH BREAKFAST. TAKE WITH ORANGE JUICE OF VITAMIN C TO HELP ABSORPTION. 90 tablet 1  . ipratropium-albuterol (DUONEB) 0.5-2.5 (3) MG/3ML SOLN USE 3 MLS BY NEBULIZATION 4 (FOUR) TIMES DAILY. 360 mL 5  . meclizine (ANTIVERT) 25 MG tablet Take 25 mg by mouth 3 (three) times daily as needed for dizziness.    [name] omeprazole (PRILOSEC) 20 MG capsule Take 1 capsule (20 mg total) by mouth as directed. 20 mg twice daily for 4 weeks then decrease to 20 mg daily. (Patient taking differently: Take 20 mg by mouth 2 (two) times daily before a meal. ) 90 capsule 3  . OXYGEN Inhale 2 L/min into the lungs continuous.    [name] PRESCRIPTION MEDICATION Inject into the skin. Iron infusion/ Dr. Lorenso Courier    . sennosides-docusate sodium (SENOKOT-S) 8.6-50 MG tablet Take 2 tablets by mouth daily as needed for constipation.     . sodium chloride (OCEAN) 0.65 % nasal spray Place 1 spray into the nose daily as needed for congestion.     . tamsulosin  (FLOMAX) 0.4 MG CAPS capsule Take 0.4 mg by mouth daily.    . traMADol (ULTRAM) 50 MG tablet Take 50 mg by mouth every 6 (six) hours as needed for moderate pain.    [name] triamcinolone cream (KENALOG) 0.1 % Apply 1 application topically 2 (two) times daily.      No current facility-administered medications for this visit.    REVIEW OF SYSTEMS:   Constitutional: ( - ) fevers, ( - )  chills , ( - ) night sweats Eyes: ( - ) blurriness of vision, ( - ) double vision, ( - ) watery eyes Ears, nose, mouth, throat, and face: ( - ) mucositis, ( - ) sore throat Respiratory: ( + ) cough, ( + ) dyspnea, ( + ) wheezes Cardiovascular: ( - ) palpitation, ( - ) chest discomfort, ( - ) lower extremity swelling  Gastrointestinal:  ( - ) nausea, ( - ) heartburn, ( - ) change in bowel habits Skin: ( - ) abnormal skin rashes Lymphatics: ( - ) new lymphadenopathy, ( - ) easy bruising Neurological: ( - ) numbness, ( - ) tingling, ( - ) new weaknesses Behavioral/Psych: ( - ) mood change, ( - ) new changes  All other systems were reviewed with the patient and are negative.  PHYSICAL EXAMINATION: ECOG PERFORMANCE STATUS: 3 - Symptomatic, >50% confined to bed  Vitals:   07/15/20 1442  BP: 114/64  Pulse: 75  Resp: 20  Temp: 98 F (36.7 C)  SpO2: 94%   Filed Weights   07/15/20 1442  Weight: 133 lb 9.6 oz (60.6 kg)    GENERAL: chronically ill appearing elderly Caucasian male in no distress and comfortable. In a wheelchair SKIN: skin color, texture, turgor are normal, no rashes or significant lesions EYES: conjunctiva are pink and non-injected, sclera clear LUNGS: clear to auscultation and percussion with normal breathing effort HEART: regular rate & rhythm and no murmurs and no lower extremity edema Musculoskeletal: no cyanosis of digits and no clubbing  PSYCH: alert & oriented x 3, fluent speech NEURO: no focal motor/sensory deficits  LABORATORY DATA:  I have reviewed the data as listed Recent Results  (from the past 2160 hour(s))  CBC with Differential (Cancer Center Only)     Status: Abnormal   Collection Time: 07/15/20  2:18 PM  Result Value Ref Range   WBC Count 5.7 4.0 - 10.5 K/uL   RBC 3.75 (L) 4.22 - 5.81 MIL/uL   Hemoglobin 11.8 (L) 13.0 - 17.0 g/dL   HCT 35.8 (L) 39.0 - 52.0 %   MCV 95.5 80.0 - 100.0 fL   MCH 31.5 26.0 - 34.0 pg   MCHC 33.0 30.0 - 36.0 g/dL   RDW 11.9 11.5 - 15.5 %   Platelet Count 218 150 - 400 K/uL   nRBC 0.0 0.0 - 0.2 %   Neutrophils Relative % 64 %   Neutro Abs 3.7 1.7 - 7.7 K/uL   Lymphocytes Relative 20 %   Lymphs Abs 1.1 0.7 - 4.0 K/uL   Monocytes Relative 10 %   Monocytes Absolute 0.6 0.1 - 1.0 K/uL   Eosinophils Relative 5 %   Eosinophils Absolute 0.3 0.0 - 0.5 K/uL   Basophils Relative 1 %   Basophils Absolute 0.0 0.0 - 0.1 K/uL   Immature Granulocytes 0 %   Abs Immature Granulocytes 0.01 0.00 - 0.07 K/uL    Comment: Performed at [facility], [address] [place], [place] [address]  CMP ([facility] only)     Status: Abnormal   Collection Time: 07/15/20  2:18 PM  Result Value Ref Range   Sodium 136 135 - 145 mmol/L   Potassium 3.6 3.5 - 5.1 mmol/L   Chloride 101 98 - 111 mmol/L   CO2 28 22 - 32 mmol/L   Glucose, Bld 112 (H) 70 - 99 mg/dL    Comment: Glucose reference range applies only to samples taken after fasting for at least 8 hours.   BUN 21 8 - 23 mg/dL   Creatinine 1.47 (H) 0.61 - 1.24 mg/dL   Calcium 9.1 8.9 - 10.3 mg/dL   Total Protein 6.5 6.5 - 8.1 g/dL   Albumin 4.1 3.5 - 5.0 g/dL   AST 23 15 - 41 U/L   ALT 16 0 - 44 U/L   Alkaline Phosphatase 64 38 - 126 U/L  Total Bilirubin 0.5 0.3 - 1.2 mg/dL   GFR, Estimated 48 (L) >60 mL/min    Comment: (NOTE) Calculated using the CKD-EPI Creatinine Equation (2021)    Anion gap 7 5 - 15    Comment: Performed at [facility], [address] [place], [place] [address]  Iron and TIBC     Status: None   Collection Time:  07/15/20  2:18 PM  Result Value Ref Range   Iron 101 42 - 163 ug/dL   TIBC 294 202 - 409 ug/dL   Saturation Ratios 34 20 - 55 %   UIBC 193 117 - 376 ug/dL    Comment: Performed at [facility], [place] [address], [place], [place] [address]  Ferritin     Status: None   Collection Time: 07/15/20  2:18 PM  Result Value Ref Range   Ferritin 243 24 - 336 ng/mL    Comment: Performed at [facility], [place] [address], [address] [place], [place] [address]  Retic Panel     Status: Abnormal   Collection Time: 07/15/20  2:18 PM  Result Value Ref Range   Retic Ct Pct 1.2 0.4 - 3.1 %   RBC. 3.73 (L) 4.22 - 5.81 MIL/uL   Retic Count, Absolute 45.9 19.0 - 186.0 K/uL   Immature Retic Fract 4.6 2.3 - 15.9 %   Reticulocyte Hemoglobin 35.1 >27.9 pg    Comment:        Given the high negative predictive value of a RET-He result > 32 pg iron deficiency is essentially excluded. If this patient is anemic other etiologies should be considered. Performed at [facility], [place] [address], [place], [place] [address]     RADIOGRAPHIC STUDIES: No relevant radiographic studies.  ASSESSMENT & PLAN Tyler Horne 80 y.o. male with medical history significant for COPD on home O2, GERD, and postnasal drip who presents for f/u of a normocytic anemia and iron deficiency.   Labs today are consistent with continued mild anemia.  On his initial visit he was recommended to start on iron 325 mg p.o. daily with a source of vitamin C. given the relative stability of his iron levels and hemoglobin I was concerned that the patient was not necessarily taking his iron as prescribed (his clinic visits are often tangential and difficult to focus, unclear if some component of cognitive decline).   Fortunately the patient has undergone GI evaluation with endoscopy and colonoscopy.  Colonoscopy was significant for internal hemorrhoids, but no overt source of bleeding was  discovered.  Upper endoscopy revealed no clear sources of bleeding as well.  It was noted that if the patient continues to be iron deficient capsule endoscopy could be considered.  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  --work up for alternative etiologies of his anemia were negative, included B12, folate, and TSH. May have some component of low Hgb 2/2 to renal dysfunction.  --patient has undergone evaluation with EGD and colonoscopy in Jan 2021 which showed internal hemorrhoids and 3 mm polyp, but no other overt sources of bleeding.  --continue iron pills 346m PO daily with a source of vitamin C. Re-emphasized the proper way to take these pills today.  --completed iron sucrose x 5 doses in June 2021 -- no clear indication for a bone marrow biopsy at this time.  --RTC in 6 months to continue monitoring  All  questions were answered. The patient knows to call the clinic with any problems, questions or concerns.  A total of more than 30 minutes were spent face-to-face with the patient during this encounter and over half of that time was spent on counseling and coordination of care as outlined above.   Ledell Peoples, MD Department of Hematology/Oncology [place] [facility] Phone: [phone] Pager: [phone] Email: [email or web address]  07/19/2020 8:17 PM

## 2020-07-20 ENCOUNTER — Telehealth: Payer: Self-pay | Admitting: Hematology and Oncology

## 2020-07-20 DIAGNOSIS — H52223 Regular astigmatism, bilateral: Secondary | ICD-10-CM | POA: Diagnosis not present

## 2020-07-20 NOTE — Telephone Encounter (Signed)
Scheduled appt per 2/15 sch msg - mailed letter with appt date and time

## 2020-07-23 DIAGNOSIS — J449 Chronic obstructive pulmonary disease, unspecified: Secondary | ICD-10-CM | POA: Diagnosis not present

## 2020-07-23 DIAGNOSIS — R062 Wheezing: Secondary | ICD-10-CM | POA: Diagnosis not present

## 2020-08-10 DIAGNOSIS — J209 Acute bronchitis, unspecified: Secondary | ICD-10-CM | POA: Diagnosis not present

## 2020-08-20 DIAGNOSIS — R062 Wheezing: Secondary | ICD-10-CM | POA: Diagnosis not present

## 2020-08-20 DIAGNOSIS — J449 Chronic obstructive pulmonary disease, unspecified: Secondary | ICD-10-CM | POA: Diagnosis not present

## 2020-09-20 DIAGNOSIS — J449 Chronic obstructive pulmonary disease, unspecified: Secondary | ICD-10-CM | POA: Diagnosis not present

## 2020-09-20 DIAGNOSIS — R062 Wheezing: Secondary | ICD-10-CM | POA: Diagnosis not present

## 2020-10-20 DIAGNOSIS — R062 Wheezing: Secondary | ICD-10-CM | POA: Diagnosis not present

## 2020-10-20 DIAGNOSIS — J449 Chronic obstructive pulmonary disease, unspecified: Secondary | ICD-10-CM | POA: Diagnosis not present

## 2020-11-16 DIAGNOSIS — R42 Dizziness and giddiness: Secondary | ICD-10-CM | POA: Diagnosis not present

## 2020-11-16 DIAGNOSIS — R5383 Other fatigue: Secondary | ICD-10-CM | POA: Diagnosis not present

## 2020-11-16 DIAGNOSIS — Z7289 Other problems related to lifestyle: Secondary | ICD-10-CM | POA: Diagnosis not present

## 2020-11-16 DIAGNOSIS — Z125 Encounter for screening for malignant neoplasm of prostate: Secondary | ICD-10-CM | POA: Diagnosis not present

## 2020-11-16 DIAGNOSIS — E785 Hyperlipidemia, unspecified: Secondary | ICD-10-CM | POA: Diagnosis not present

## 2020-11-16 DIAGNOSIS — Z79899 Other long term (current) drug therapy: Secondary | ICD-10-CM | POA: Diagnosis not present

## 2020-11-16 DIAGNOSIS — Z1389 Encounter for screening for other disorder: Secondary | ICD-10-CM | POA: Diagnosis not present

## 2020-11-16 DIAGNOSIS — Z131 Encounter for screening for diabetes mellitus: Secondary | ICD-10-CM | POA: Diagnosis not present

## 2020-11-16 DIAGNOSIS — R1032 Left lower quadrant pain: Secondary | ICD-10-CM | POA: Diagnosis not present

## 2020-11-16 DIAGNOSIS — R002 Palpitations: Secondary | ICD-10-CM | POA: Diagnosis not present

## 2020-11-16 DIAGNOSIS — D485 Neoplasm of uncertain behavior of skin: Secondary | ICD-10-CM | POA: Diagnosis not present

## 2020-11-16 DIAGNOSIS — E039 Hypothyroidism, unspecified: Secondary | ICD-10-CM | POA: Diagnosis not present

## 2020-11-16 DIAGNOSIS — I10 Essential (primary) hypertension: Secondary | ICD-10-CM | POA: Diagnosis not present

## 2020-11-16 DIAGNOSIS — I70219 Atherosclerosis of native arteries of extremities with intermittent claudication, unspecified extremity: Secondary | ICD-10-CM | POA: Diagnosis not present

## 2020-11-16 DIAGNOSIS — H9113 Presbycusis, bilateral: Secondary | ICD-10-CM | POA: Diagnosis not present

## 2020-11-16 DIAGNOSIS — Z1331 Encounter for screening for depression: Secondary | ICD-10-CM | POA: Diagnosis not present

## 2020-11-16 DIAGNOSIS — E7800 Pure hypercholesterolemia, unspecified: Secondary | ICD-10-CM | POA: Diagnosis not present

## 2020-11-16 DIAGNOSIS — Z0000 Encounter for general adult medical examination without abnormal findings: Secondary | ICD-10-CM | POA: Diagnosis not present

## 2020-11-20 DIAGNOSIS — R062 Wheezing: Secondary | ICD-10-CM | POA: Diagnosis not present

## 2020-11-20 DIAGNOSIS — J449 Chronic obstructive pulmonary disease, unspecified: Secondary | ICD-10-CM | POA: Diagnosis not present

## 2020-12-16 ENCOUNTER — Telehealth: Payer: Self-pay | Admitting: Emergency Medicine

## 2020-12-16 MED ORDER — IPRATROPIUM-ALBUTEROL 0.5-2.5 (3) MG/3ML IN SOLN: 3.0000 mL | Freq: Four times a day (QID) | RESPIRATORY_TRACT | 5 refills | Status: DC

## 2020-12-16 NOTE — Telephone Encounter (Signed)
Called and spoke with patient's brother (DPR) verified that patient needs a refill of his ipratropium-albuterol nebulizer solution.  Verified pharmacy and script sent.  Patient has a f/u appointment with Dr. Lamonte Sakai 01/18/2021.  Nothing further needed.

## 2020-12-16 NOTE — Telephone Encounter (Signed)
Pts brother; Camila Li; stated that he is needing a refill for his nebulizer medication.  Pharmacy; [id number] [place], [address], [place] [address]   Pls regard; [phone]

## 2020-12-20 DIAGNOSIS — J449 Chronic obstructive pulmonary disease, unspecified: Secondary | ICD-10-CM | POA: Diagnosis not present

## 2020-12-20 DIAGNOSIS — R062 Wheezing: Secondary | ICD-10-CM | POA: Diagnosis not present

## 2020-12-21 ENCOUNTER — Other Ambulatory Visit: Payer: Self-pay | Admitting: Hematology and Oncology

## 2021-01-02 ENCOUNTER — Telehealth: Payer: Self-pay | Admitting: Hematology and Oncology

## 2021-01-02 NOTE — Telephone Encounter (Signed)
R/s 8/15 appt due to provider pal. Called and spoke with patients brother. Confirmed new date and time

## 2021-01-16 ENCOUNTER — Ambulatory Visit: Payer: Medicare HMO | Admitting: Hematology and Oncology

## 2021-01-16 ENCOUNTER — Other Ambulatory Visit: Payer: Medicare HMO

## 2021-01-16 DIAGNOSIS — J209 Acute bronchitis, unspecified: Secondary | ICD-10-CM | POA: Diagnosis not present

## 2021-01-18 ENCOUNTER — Other Ambulatory Visit: Payer: Self-pay

## 2021-01-18 ENCOUNTER — Encounter: Payer: Self-pay | Admitting: Emergency Medicine

## 2021-01-18 ENCOUNTER — Ambulatory Visit: Payer: Medicare HMO | Admitting: Emergency Medicine

## 2021-01-18 DIAGNOSIS — J9611 Chronic respiratory failure with hypoxia: Secondary | ICD-10-CM

## 2021-01-18 DIAGNOSIS — J449 Chronic obstructive pulmonary disease, unspecified: Secondary | ICD-10-CM

## 2021-01-18 MED ORDER — IPRATROPIUM-ALBUTEROL 0.5-2.5 (3) MG/3ML IN SOLN: 3.0000 mL | Freq: Four times a day (QID) | RESPIRATORY_TRACT | 5 refills | Status: DC

## 2021-01-18 NOTE — Patient Instructions (Signed)
Please continue your DuoNeb as you have been taking it.  You need to take 4 vials daily to get the full dose (in separated treatments). Keep albuterol available to use 2 puffs if you need it for shortness of breath, chest tightness, wheezing in between your nebulizer treatments. Continue your oxygen at 2-4 L/min pulsed depending on your level of exertion. Agree with getting the COVID-19 vaccine Follow with Dr. Lamonte Sakai in 12 months or sooner if you have any problems.

## 2021-01-18 NOTE — Assessment & Plan Note (Signed)
No exacerbations.  He has persistent daily symptoms and is quite limited.  I have tried to encourage him to get on scheduled bronchodilator therapy.  Currently he is using DuoNeb but only partial treatments and uses it through the day.  He is averaging 4-5 vials of medication in a day which would be as much as I want him to take, just not on the standard schedule.  He is interested in getting the COVID-19 vaccine and is planning to do so today.  Please continue your DuoNeb as you have been taking it.  You need to take 4 vials daily to get the full dose (in separated treatments). Keep albuterol available to use 2 puffs if you need it for shortness of breath, chest tightness, wheezing in between your nebulizer treatments. Agree with getting the COVID-19 vaccine Follow with Dr. Lamonte Sakai in 12 months or sooner if you have any problems.

## 2021-01-18 NOTE — Assessment & Plan Note (Signed)
Good compliance with his oxygen, using 2-4 L/min depending on his level of exertion.

## 2021-01-18 NOTE — Addendum Note (Signed)
Addended by: Gavin Potters R on: 01/18/2021 12:07 PM   Modules accepted: Orders

## 2021-01-18 NOTE — Progress Notes (Signed)
Subjective:    Patient ID: Tyler Horne, male    DOB: [birth year], 80 y.o.   MRN: [id number]  HPI  ROV 12/17/19 --follow-up visit for 80 year old man with history of tobacco use and very severe COPD. He quit smoking in April 2021.  He also has GERD and chronic rhinitis, documented hypoxemic respiratory failure.  He is using oxygen at 2 L/min, has been shown to desaturate on this in the past but appears to be tolerating today. He has a pulsed system. He is using DuoNeb as needed, but he doesn't take the whole treatment, leaves the remainder for later. He is not interested in taking a full treatment on the schedule I've prescribed. He will still sometimes use Primatene mist. No flares, no ED visits since last time. He has been treated for Fe def anemia, has a negative CSY and EGD.   ROV 01/18/21 --80 year old man with a history of tobacco use and very severe COPD.  He is back to smoking a few cigarettes weekly.  He has associated exertional hypoxemic respiratory failure and uses oxygen at 2 L/min pulsed. He uses DuoNeb, mainly as needed as opposed to on a schedule. He is still using Duoneb, not the entire treatment, about 10 x a day. Averages 5 vials a day.  No flares since last time. No abx or pred.  Has not had any COVID vaccine - planning to do so today.    Review of Systems  Constitutional: Negative.  Negative for fever and unexpected weight change.  HENT:  Positive for postnasal drip and rhinorrhea. Negative for congestion, dental problem, ear pain, nosebleeds, sinus pressure, sneezing, sore throat and trouble swallowing.   Eyes: Negative.  Negative for redness and itching.  Respiratory:  Positive for cough and shortness of breath. Negative for chest tightness and wheezing.   Cardiovascular: Negative.  Negative for palpitations and leg swelling.  Gastrointestinal: Negative.  Negative for nausea and vomiting.  Genitourinary: Negative.  Negative for dysuria.  Musculoskeletal: Negative.  Negative  for joint swelling.  Skin: Negative.  Negative for rash.  Neurological: Negative.  Negative for headaches.  Hematological: Negative.  Does not bruise/bleed easily.  Psychiatric/Behavioral: Negative.  Negative for dysphoric mood. The patient is not nervous/anxious.       Objective:   Physical Exam Vitals:   01/18/21 1111  BP: 124/78  Pulse: 97  Temp: 97.8 F (36.6 C)  TempSrc: Oral  SpO2: 96%  Weight: 133 lb 12.8 oz (60.7 kg)  Height: '5\' 9"'$  (1.753 m)    Gen: Pleasant, thin with some temporal wasting, in no distress,  normal affect  ENT: No lesions,  mouth clear,  oropharynx clear, no postnasal drip  Neck: No JVD, no stridor  Lungs: No use of accessory muscles, distant and decreased throughout, few exp wheezes.   Cardiovascular: RRR, heart sounds normal, no murmur or gallops, no peripheral edema  Musculoskeletal: No deformities, no cyanosis or clubbing  Neuro: alert, non focal  Skin: Warm, no lesions or rashes     Assessment & Plan:  Chronic respiratory failure with hypoxia (HCC) Good compliance with his oxygen, using 2-4 L/min depending on his level of exertion.  COPD (chronic obstructive pulmonary disease) (HCC) No exacerbations.  He has persistent daily symptoms and is quite limited.  I have tried to encourage him to get on scheduled bronchodilator therapy.  Currently he is using DuoNeb but only partial treatments and uses it through the day.  He is averaging 4-5 vials of medication in a day  which would be as much as I want him to take, just not on the standard schedule.  He is interested in getting the COVID-19 vaccine and is planning to do so today.  Please continue your DuoNeb as you have been taking it.  You need to take 4 vials daily to get the full dose (in separated treatments). Keep albuterol available to use 2 puffs if you need it for shortness of breath, chest tightness, wheezing in between your nebulizer treatments. Agree with getting the COVID-19  vaccine Follow with Dr. Lamonte Sakai in 12 months or sooner if you have any problems.   Time spent 35 minutes   Baltazar Apo, MD, PhD 01/18/2021, 11:36 AM [facility] [phone] or if no answer [phone]

## 2021-01-20 DIAGNOSIS — R062 Wheezing: Secondary | ICD-10-CM | POA: Diagnosis not present

## 2021-01-20 DIAGNOSIS — J449 Chronic obstructive pulmonary disease, unspecified: Secondary | ICD-10-CM | POA: Diagnosis not present

## 2021-01-30 ENCOUNTER — Other Ambulatory Visit: Payer: Self-pay | Admitting: Hematology and Oncology

## 2021-01-30 ENCOUNTER — Inpatient Hospital Stay: Payer: Medicare HMO | Admitting: Hematology and Oncology

## 2021-01-30 ENCOUNTER — Inpatient Hospital Stay: Payer: Medicare HMO | Attending: Hematology and Oncology

## 2021-01-30 ENCOUNTER — Other Ambulatory Visit: Payer: Self-pay

## 2021-01-30 VITALS — BP 117/68 | HR 79 | Temp 98.7°F | Resp 18 | Wt 135.5 lb

## 2021-01-30 DIAGNOSIS — R0982 Postnasal drip: Secondary | ICD-10-CM | POA: Insufficient documentation

## 2021-01-30 DIAGNOSIS — K219 Gastro-esophageal reflux disease without esophagitis: Secondary | ICD-10-CM | POA: Insufficient documentation

## 2021-01-30 DIAGNOSIS — D509 Iron deficiency anemia, unspecified: Secondary | ICD-10-CM

## 2021-01-30 DIAGNOSIS — Z9981 Dependence on supplemental oxygen: Secondary | ICD-10-CM | POA: Insufficient documentation

## 2021-01-30 DIAGNOSIS — D649 Anemia, unspecified: Secondary | ICD-10-CM | POA: Diagnosis not present

## 2021-01-30 DIAGNOSIS — J449 Chronic obstructive pulmonary disease, unspecified: Secondary | ICD-10-CM | POA: Diagnosis not present

## 2021-01-30 LAB — CMP (CANCER CENTER ONLY)
ALT: 25 U/L (ref 0–44)
AST: 26 U/L (ref 15–41)
Albumin: 3.8 g/dL (ref 3.5–5.0)
Alkaline Phosphatase: 115 U/L (ref 38–126)
Anion gap: 9 (ref 5–15)
BUN: 14 mg/dL (ref 8–23)
CO2: 28 mmol/L (ref 22–32)
Calcium: 9.2 mg/dL (ref 8.9–10.3)
Chloride: 106 mmol/L (ref 98–111)
Creatinine: 1.51 mg/dL — ABNORMAL HIGH (ref 0.61–1.24)
GFR, Estimated: 46 mL/min — ABNORMAL LOW (ref >60)
Glucose, Bld: 93 mg/dL (ref 70–99)
Potassium: 4 mmol/L (ref 3.5–5.1)
Sodium: 143 mmol/L (ref 135–145)
Total Bilirubin: 0.4 mg/dL (ref 0.3–1.2)
Total Protein: 6.4 g/dL — ABNORMAL LOW (ref 6.5–8.1)

## 2021-01-30 LAB — CBC WITH DIFFERENTIAL (CANCER CENTER ONLY)
Abs Immature Granulocytes: 0.01 10*3/uL (ref 0.00–0.07)
Basophils Absolute: 0.1 10*3/uL (ref 0.0–0.1)
Basophils Relative: 1 %
Eosinophils Absolute: 0.6 10*3/uL — ABNORMAL HIGH (ref 0.0–0.5)
Eosinophils Relative: 9 %
HCT: 37.6 % — ABNORMAL LOW (ref 39.0–52.0)
Hemoglobin: 12.3 g/dL — ABNORMAL LOW (ref 13.0–17.0)
Immature Granulocytes: 0 %
Lymphocytes Relative: 24 %
Lymphs Abs: 1.8 10*3/uL (ref 0.7–4.0)
MCH: 31.2 pg (ref 26.0–34.0)
MCHC: 32.7 g/dL (ref 30.0–36.0)
MCV: 95.4 fL (ref 80.0–100.0)
Monocytes Absolute: 0.5 10*3/uL (ref 0.1–1.0)
Monocytes Relative: 7 %
Neutro Abs: 4.4 10*3/uL (ref 1.7–7.7)
Neutrophils Relative %: 59 %
Platelet Count: 289 10*3/uL (ref 150–400)
RBC: 3.94 MIL/uL — ABNORMAL LOW (ref 4.22–5.81)
RDW: 12.8 % (ref 11.5–15.5)
WBC Count: 7.4 10*3/uL (ref 4.0–10.5)
nRBC: 0 % (ref 0.0–0.2)

## 2021-01-30 LAB — RETIC PANEL
Immature Retic Fract: 6.5 % (ref 2.3–15.9)
RBC.: 3.88 MIL/uL — ABNORMAL LOW (ref 4.22–5.81)
Retic Count, Absolute: 53.2 10*3/uL (ref 19.0–186.0)
Retic Ct Pct: 1.4 % (ref 0.4–3.1)
Reticulocyte Hemoglobin: 35.3 pg (ref >27.9)

## 2021-01-30 NOTE — Progress Notes (Signed)
[place] [phone]   [phone]  PROGRESS NOTE  Patient Care Team: Tyler Dy, MD as PCP - General (Internal Medicine)  Hematological/Oncological History #Normocytic Anemia #Iron Deficiency Anemia 1) Establish care with Dr. Lorenso Horne on 03/27/19 due to Hgb drop 14-12.2 per outside records 2) 03/27/2019: Hgb 11.5, WBC 6.6, Plt 301. Iron 70, TIBC 429, Sat 16% ferritin 16 3) 04/24/2019: Hgb 11.5, WBC 6.0, Plt 298. Iron 71, TIBC 369, Sat 19%, ferritin 15 4) 06/18/2019: EGD and colonoscopy performed. Revealed internal hemorrhoids and 3 mm polyp as well as dark stool throughout the colon.  5)  06/25/2019: Hgb 11.6, WBC 7.1, Plt 272 . Iron 57, TIBC 398, Sat 14%, Ferritin 22 6) 11/2019: received IV ferrous sucrose 221m x 5 doses 7) 01/14/2020: WBC 7.3, Hgb 11.8, Plt 231, Iron/Ferritin pending.  8) 01/30/2021: WBC 7.4, Hgb 12.3, MCV 95.4, Plt 289  Interval History:  Tyler Phillippi80y.o. male with medical history significant for iron deficiency anemia presents for a follow up visit. He was last seen on 2/11/202.. In the interim since his last visit he has had no hospitalizations or ED visits.  On exam today Tyler Horne accompanied by his brother.  He notes he has been feeling good in the interim since our last visit.  He is breathing well.  He reports that he was told in the morning to sit on the side of the bed before standing up to urinate.  He notes that this is helped him to catch his breath falls or accidents.  He notes he has been compliant with his iron pills but does occasionally have bouts of constipation.  Last 6 months he did have 1 episode where he required enema with his stool softener since then.  He notes that he is also eating hot dogs and meat to try to increase his iron intake.  He otherwise denies any overt signs of bleeding, bruising, or dark stools.  A full 10 point ROS is listed below.  MEDICAL HISTORY:  Past Medical History:  Diagnosis  Date   Anemia    Arthritis    DDD, shoulder   Asthma    hayfever, seasonal allergies, + smoker, uses primatene pill on occas.    COPD (chronic obstructive pulmonary disease) (HCC)    GERD (gastroesophageal reflux disease)    uses Rolaids on rare occas.    Mass of finger    cystic mass right index finger   Postnasal drip    uses Benadryl & primatene as needed    Supplemental oxygen dependent    continuous   Wears dentures     SURGICAL HISTORY: Past Surgical History:  Procedure Laterality Date   BIOPSY  06/18/2019   Procedure: BIOPSY;  Surgeon: Tyler Copas, MD;  [facility];  Service: Gastroenterology;;   COLONOSCOPY WITH PROPOFOL N/A 06/18/2019   Procedure: COLONOSCOPY WITH PROPOFOL;  Surgeon: Tyler Copas, MD;  [place] [facility]  Service: Gastroenterology;  Laterality: N/A;   CYST EXCISION Right 12/18/2019   Procedure: EXCISION CYSTIC MASS WITH DISTAL INTERPHANLEGEAL JOINT ARTHROTOMY RIGHT INDEX FINGER;  Surgeon: Tyler Brod MD;  [place] [facility]  Service: Orthopedics;  Laterality: Right;  IV REGIONAL FOREARM BLOCK   ESOPHAGOGASTRODUODENOSCOPY (EGD) WITH PROPOFOL N/A 06/18/2019   Procedure: ESOPHAGOGASTRODUODENOSCOPY (EGD) WITH PROPOFOL;  Surgeon: MRush LandmarkGTelford Nab, MD;  [place] [facility]  Service: Gastroenterology;  Laterality: N/A;   GANGLION CYST EXCISION Left    L hand    LUMBAR LAMINECTOMY/DECOMPRESSION MICRODISCECTOMY  Left 10/02/2012   Procedure: LUMBAR LAMINECTOMY/DECOMPRESSION MICRODISCECTOMY 2 LEVELS;  Surgeon: Tyler Charter, MD;  Location: [place] [facility];  Service: Neurosurgery;  Laterality: Left;  Left Lumbar four-five Lumbar five sacral one diskectomy   PILONIDAL CYST EXCISION     POLYPECTOMY  06/18/2019   Procedure: POLYPECTOMY;  Surgeon: Mansouraty, Telford Horne., MD;  [facility];  Service: Gastroenterology;;   SHOULDER ARTHROSCOPY  2008   Left   TONSILLECTOMY      ALLERGIES:  has No Known  Allergies.  MEDICATIONS:  Current Outpatient Medications  Medication Sig Dispense Refill   albuterol (VENTOLIN HFA) 108 (90 Base) MCG/ACT inhaler Inhale 1-2 puffs into the lungs every 4 (four) hours as needed for wheezing or shortness of breath. 1 each 3   ALPRAZolam (XANAX) 0.25 MG tablet Take 0.25 mg by mouth 2 (two) times daily as needed for anxiety.     Ascorbic Acid (VITAMIN C PO) Take 1 tablet by mouth daily.     Calcium-Magnesium-Zinc (CAL-MAG-ZINC PO) Take 1 tablet by mouth daily.     Cholecalciferol (VITAMIN D3 PO) Take 1 capsule by mouth daily.     Cyanocobalamin (B-12 PO) Place 1 tablet under the tongue daily.      dextromethorphan-guaiFENesin (MUCINEX DM) 30-600 MG 12hr tablet Take 1 tablet by mouth daily as needed for cough.     diphenhydrAMINE (BENADRYL) 25 mg capsule Take 25-50 mg by mouth every 8 (eight) hours as needed for allergies or sleep.      docusate sodium 100 MG CAPS Take 100 mg by mouth 2 (two) times daily. 60 capsule 1   Ensure Plus (ENSURE PLUS) LIQD Take 237 mLs by mouth daily.     ePHEDrine-guaiFENesin 12.5-200 MG TABS Take 0.25 tablets by mouth daily as needed (for allergies).     ferrous sulfate 325 (65 FE) MG EC tablet TAKE 1 TABLET BY MOUTH DAILY WITH BREAKFAST. TAKE WITH ORANGE JUICE OF VITAMIN C TO HELP ABSORPTION. 90 tablet 1   ipratropium-albuterol (DUONEB) 0.5-2.5 (3) MG/3ML SOLN Take 3 mLs by nebulization in the morning, at noon, in the evening, and at bedtime. 460 mL 5   meclizine (ANTIVERT) 25 MG tablet Take 25 mg by mouth 3 (three) times daily as needed for dizziness.     omeprazole (PRILOSEC) 20 MG capsule Take 1 capsule (20 mg total) by mouth as directed. 20 mg twice daily for 4 weeks then decrease to 20 mg daily. (Patient taking differently: Take 20 mg by mouth 2 (two) times daily before a meal.) 90 capsule 3   OXYGEN Inhale 2 L/min into the lungs continuous.     PRESCRIPTION MEDICATION Inject into the skin. Iron infusion/ Dr. Lorenso Horne      sennosides-docusate sodium (SENOKOT-S) 8.6-50 MG tablet Take 2 tablets by mouth daily as needed for constipation.      sodium chloride (OCEAN) 0.65 % nasal spray Place 1 spray into the nose daily as needed for congestion.      tamsulosin (FLOMAX) 0.4 MG CAPS capsule Take 0.4 mg by mouth daily.     traMADol (ULTRAM) 50 MG tablet Take 50 mg by mouth every 6 (six) hours as needed for moderate pain.     triamcinolone cream (KENALOG) 0.1 % Apply 1 application topically 2 (two) times daily.     No current facility-administered medications for this visit.    REVIEW OF SYSTEMS:   Constitutional: ( - ) fevers, ( - )  chills , ( - ) night sweats Eyes: ( - )  blurriness of vision, ( - ) double vision, ( - ) watery eyes Ears, nose, mouth, throat, and face: ( - ) mucositis, ( - ) sore throat Respiratory: ( + ) cough, ( + ) dyspnea, ( + ) wheezes Cardiovascular: ( - ) palpitation, ( - ) chest discomfort, ( - ) lower extremity swelling Gastrointestinal:  ( - ) nausea, ( - ) heartburn, ( - ) change in bowel habits Skin: ( - ) abnormal skin rashes Lymphatics: ( - ) new lymphadenopathy, ( - ) easy bruising Neurological: ( - ) numbness, ( - ) tingling, ( - ) new weaknesses Behavioral/Psych: ( - ) mood change, ( - ) new changes  All other systems were reviewed with the patient and are negative.  PHYSICAL EXAMINATION: ECOG PERFORMANCE STATUS: 3 - Symptomatic, >50% confined to bed  Vitals:   01/30/21 1508  BP: 117/68  Pulse: 79  Resp: 18  Temp: 98.7 F (37.1 C)  SpO2: 96%   Filed Weights   01/30/21 1508  Weight: 135 lb 8 oz (61.5 kg)    GENERAL: chronically ill appearing elderly Caucasian male in no distress and comfortable. In a wheelchair SKIN: skin color, texture, turgor are normal, no rashes or significant lesions EYES: conjunctiva are pink and non-injected, sclera clear LUNGS: clear to auscultation and percussion with normal breathing effort HEART: regular rate & rhythm and no murmurs and no  lower extremity edema Musculoskeletal: no cyanosis of digits and no clubbing  PSYCH: alert & oriented x 3, fluent speech NEURO: no focal motor/sensory deficits  LABORATORY DATA:  I have reviewed the data as listed Recent Results (from the past 2160 hour(s))  Retic Panel     Status: Abnormal   Collection Time: 01/30/21  2:42 PM  Result Value Ref Range   Retic Ct Pct 1.4 0.4 - 3.1 %   RBC. 3.88 (L) 4.22 - 5.81 MIL/uL   Retic Count, Absolute 53.2 19.0 - 186.0 K/uL   Immature Retic Fract 6.5 2.3 - 15.9 %   Reticulocyte Hemoglobin 35.3 >27.9 pg    Comment:        Given the high negative predictive value of a RET-He result > 32 pg iron deficiency is essentially excluded. If this patient is anemic other etiologies should be considered. Performed at [facility], [place] [address], [place], [place] [address]   CMP ([place] only)     Status: Abnormal   Collection Time: 01/30/21  2:43 PM  Result Value Ref Range   Sodium 143 135 - 145 mmol/L   Potassium 4.0 3.5 - 5.1 mmol/L   Chloride 106 98 - 111 mmol/L   CO2 28 22 - 32 mmol/L   Glucose, Bld 93 70 - 99 mg/dL    Comment: Glucose reference range applies only to samples taken after fasting for at least 8 hours.   BUN 14 8 - 23 mg/dL   Creatinine 1.51 (H) 0.61 - 1.24 mg/dL   Calcium 9.2 8.9 - 10.3 mg/dL   Total Protein 6.4 (L) 6.5 - 8.1 g/dL   Albumin 3.8 3.5 - 5.0 g/dL   AST 26 15 - 41 U/L   ALT 25 0 - 44 U/L   Alkaline Phosphatase 115 38 - 126 U/L   Total Bilirubin 0.4 0.3 - 1.2 mg/dL   GFR, Estimated 46 (L) >60 mL/min    Comment: (NOTE) Calculated using the CKD-EPI Creatinine Equation (2021)    Anion gap 9 5 - 15    Comment:  Performed at [facility], [place] [address], [place],  [address]  CBC with Differential ([place] Only)     Status: Abnormal   Collection Time: 01/30/21  2:43 PM  Result Value Ref Range   WBC Count 7.4 4.0 - 10.5 K/uL   RBC 3.94 (L) 4.22 -  5.81 MIL/uL   Hemoglobin 12.3 (L) 13.0 - 17.0 g/dL   HCT 37.6 (L) 39.0 - 52.0 %   MCV 95.4 80.0 - 100.0 fL   MCH 31.2 26.0 - 34.0 pg   MCHC 32.7 30.0 - 36.0 g/dL   RDW 12.8 11.5 - 15.5 %   Platelet Count 289 150 - 400 K/uL   nRBC 0.0 0.0 - 0.2 %   Neutrophils Relative % 59 %   Neutro Abs 4.4 1.7 - 7.7 K/uL   Lymphocytes Relative 24 %   Lymphs Abs 1.8 0.7 - 4.0 K/uL   Monocytes Relative 7 %   Monocytes Absolute 0.5 0.1 - 1.0 K/uL   Eosinophils Relative 9 %   Eosinophils Absolute 0.6 (H) 0.0 - 0.5 K/uL   Basophils Relative 1 %   Basophils Absolute 0.1 0.0 - 0.1 K/uL   Immature Granulocytes 0 %   Abs Immature Granulocytes 0.01 0.00 - 0.07 K/uL    Comment: Performed at [facility], [place] [address] [place],  [address]    RADIOGRAPHIC STUDIES: No relevant radiographic studies.  ASSESSMENT & PLAN Payden Docter 80 y.o. male with medical history significant for COPD on home O2, GERD, and postnasal drip who presents for f/u of a normocytic anemia and iron deficiency.   Labs today are consistent with continued mild anemia.  On his initial visit he was recommended to start on iron 325 mg p.o. daily with a source of vitamin C. given the relative stability of his iron levels and hemoglobin I was concerned that the patient was not necessarily taking his iron as prescribed (his clinic visits are often tangential and difficult to focus, unclear if some component of cognitive decline).   Fortunately the patient has undergone GI evaluation with endoscopy and colonoscopy.  Colonoscopy was significant for internal hemorrhoids, but no overt source of bleeding was discovered.  Upper endoscopy revealed no clear sources of bleeding as well.  It was noted that if the patient continues to be iron deficient capsule endoscopy could be considered.   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  --Hgb today stable at 12.3 --work up for alternative etiologies of his anemia were negative, included B12, folate, and TSH. May have some component of low Hgb 2/2 to renal dysfunction.  --patient has undergone evaluation with EGD and colonoscopy in Jan 2021 which showed internal hemorrhoids and 3 mm polyp, but no other overt sources of bleeding.  --continue iron pills 337m PO daily with a source of vitamin C. Re-emphasized the proper way to take these pills today.  --completed iron sucrose x 5 doses in June 2021 -- no clear indication for a bone marrow biopsy at this time.  --RTC in 6 months to continue monitoring   All questions were answered. The patient knows to call the clinic with any problems, questions or concerns.  A total of more than 30 minutes were spent face-to-face with the patient during this encounter and over half of that time was spent on counseling and coordination of care as outlined above.   Navil Kole T.  Tyler Courier, MD Department of Hematology/Oncology [place] at [facility] Phone: [phone] Pager: [phone] Email: [email or web address]  01/30/2021 4:23 PM

## 2021-01-31 LAB — IRON AND TIBC
Iron: 94 ug/dL (ref 42–163)
Saturation Ratios: 32 % (ref 20–55)
TIBC: 290 ug/dL (ref 202–409)
UIBC: 197 ug/dL (ref 117–376)

## 2021-01-31 LAB — FERRITIN: Ferritin: 172 ng/mL (ref 24–336)

## 2021-02-20 DIAGNOSIS — J449 Chronic obstructive pulmonary disease, unspecified: Secondary | ICD-10-CM | POA: Diagnosis not present

## 2021-02-20 DIAGNOSIS — R062 Wheezing: Secondary | ICD-10-CM | POA: Diagnosis not present

## 2021-03-07 IMAGING — US US EXTREM UP *R* LTD
2 series · 14 of 15 positions shown · non-contrast
Comparison: None.

CLINICAL DATA: Soft tissue mass of the right index finger.

EXAM:
ULTRASOUND RIGHT UPPER EXTREMITY LIMITED
TECHNIQUE: Ultrasound examination of the upper extremity soft tissues was
performed in the area of clinical concern.

[Series 1: us extrem up *right* ltd · 0.04mm/px · 8 acquisitions, 7 frames shown (1 of 2)]
[im 1/8]
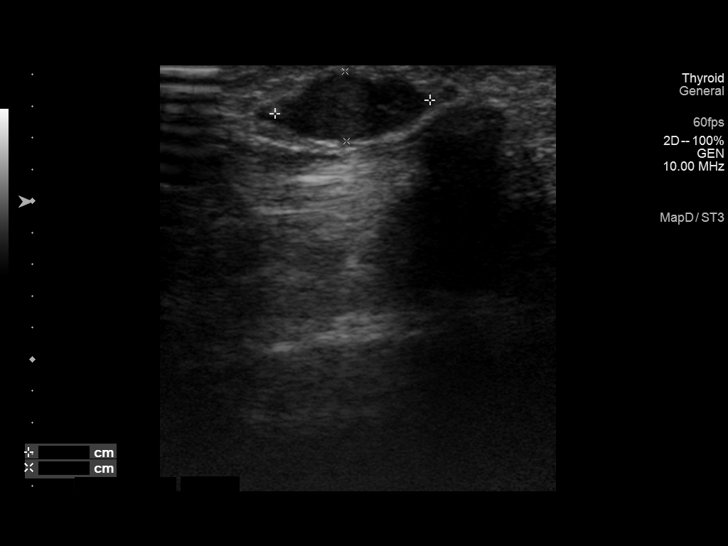
[im 2/8]
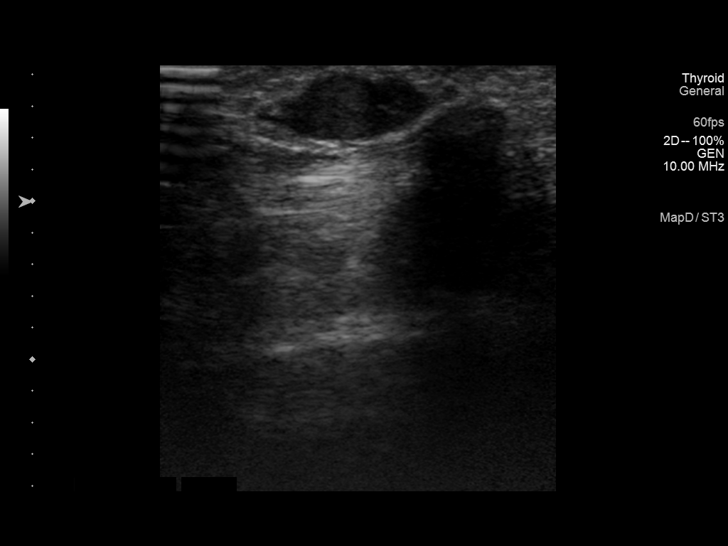
[im 3/8]
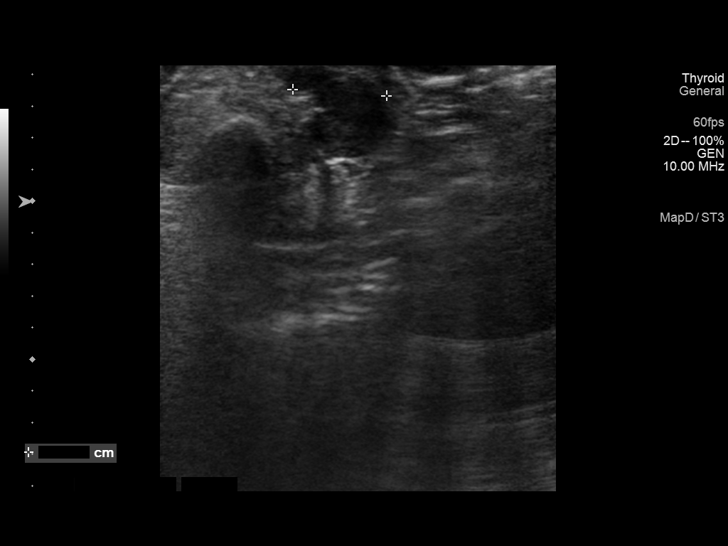
[im 4/8]
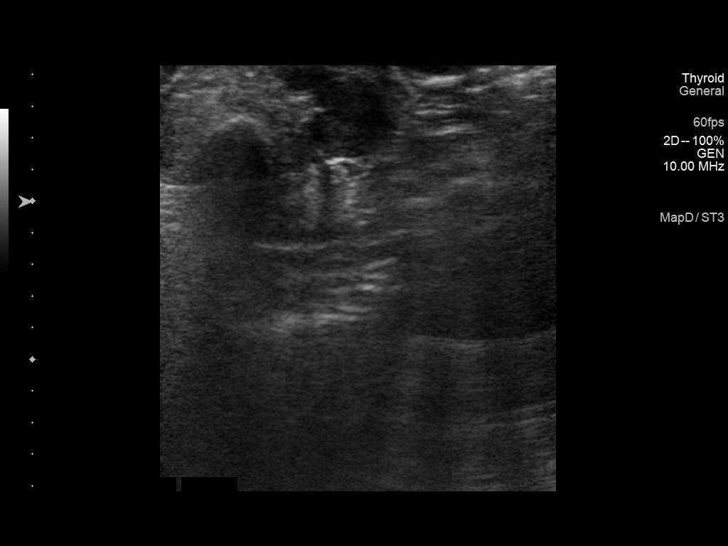
[im 5/8]
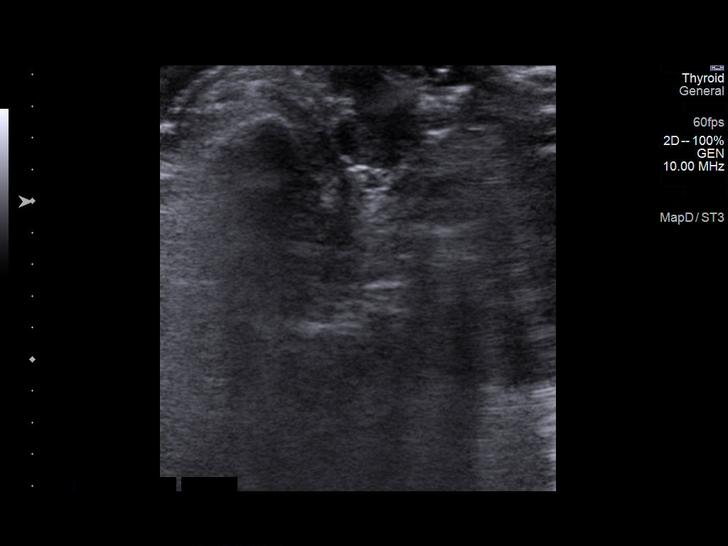
[im 6/8]
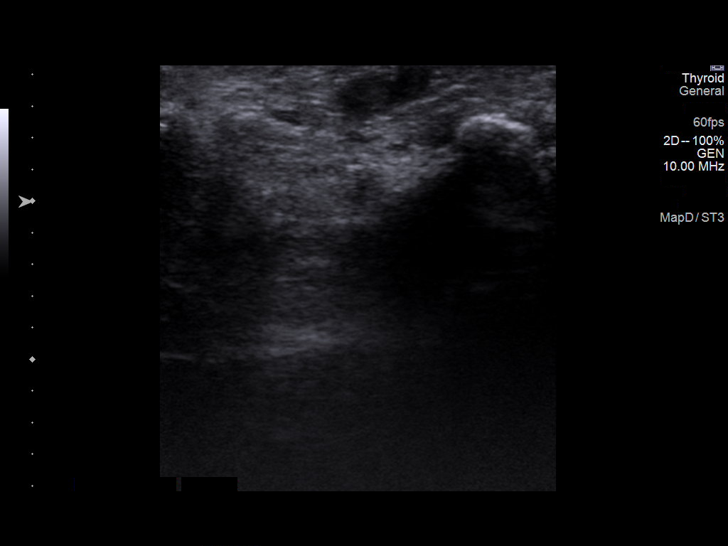
[im 7/8]
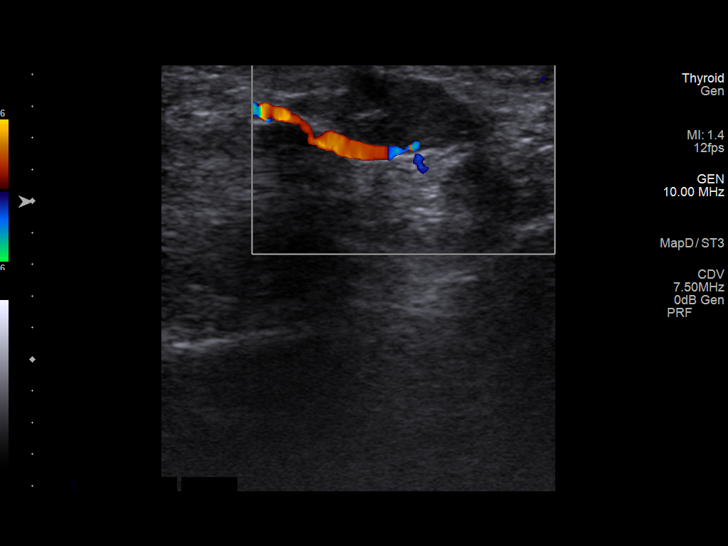

[Series 2: us extrem up *right* ltd · 0.04mm/px · 7 of 7 slices shown (2 of 2)]
[im 1/7]
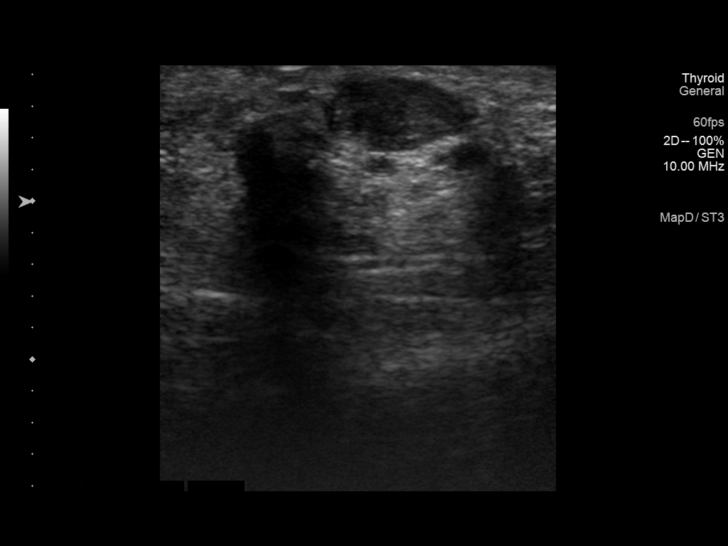
[im 2/7]
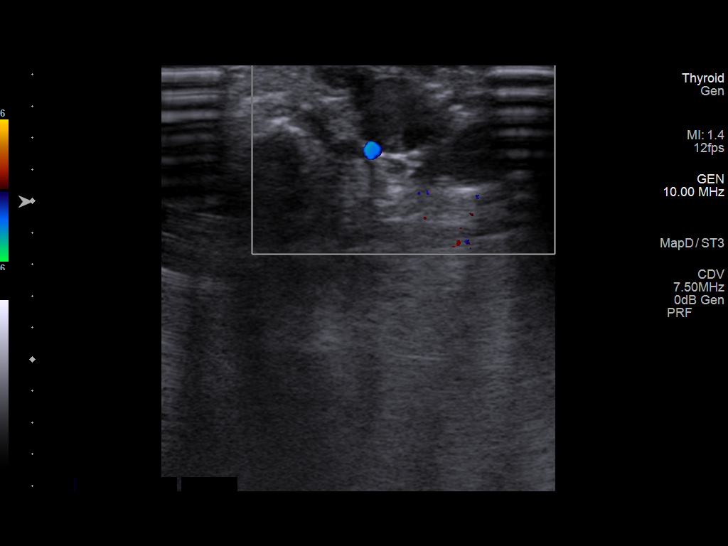
[im 3/7]
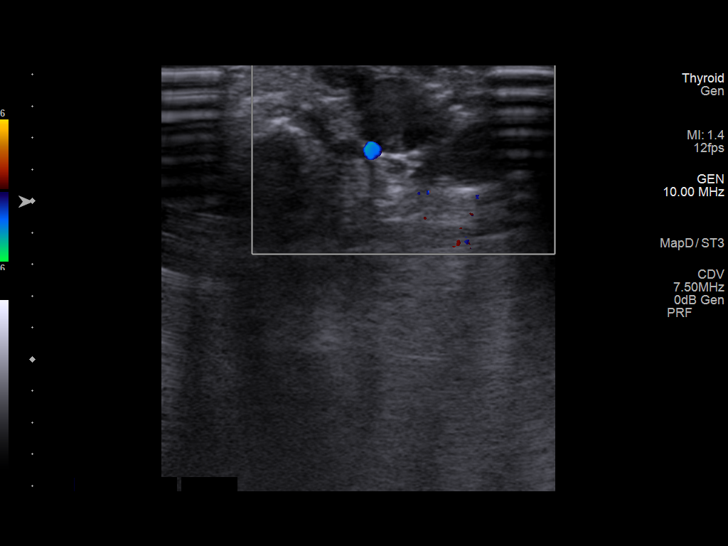
[im 4/7]
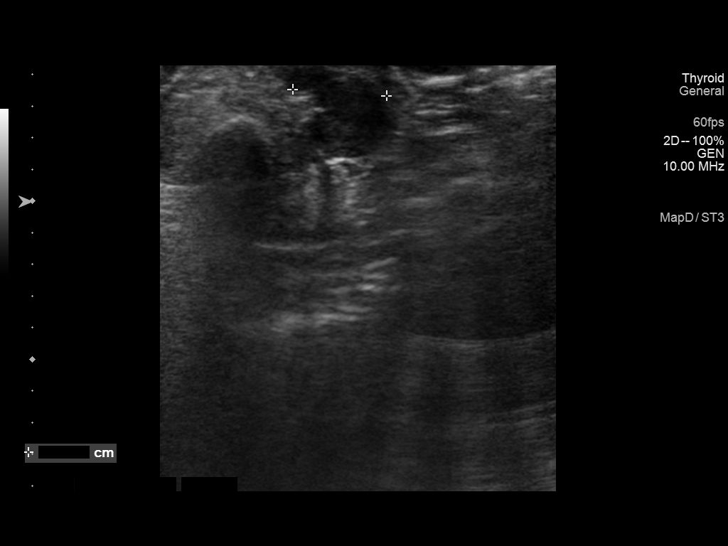
[im 5/7]
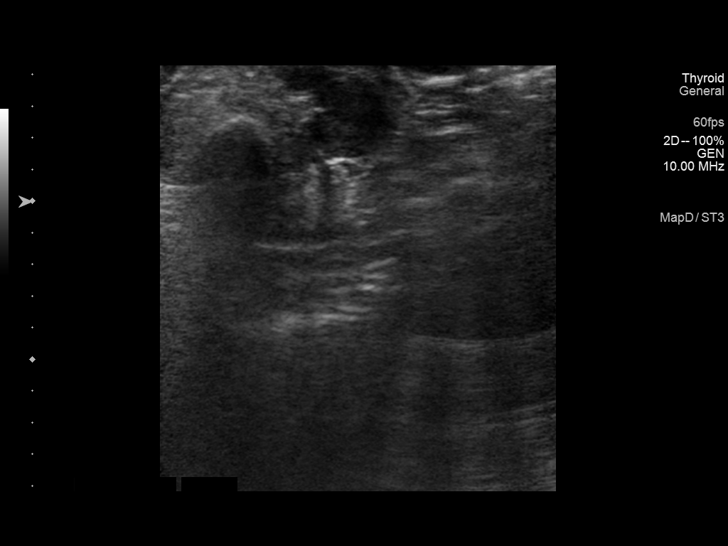
[im 6/7]
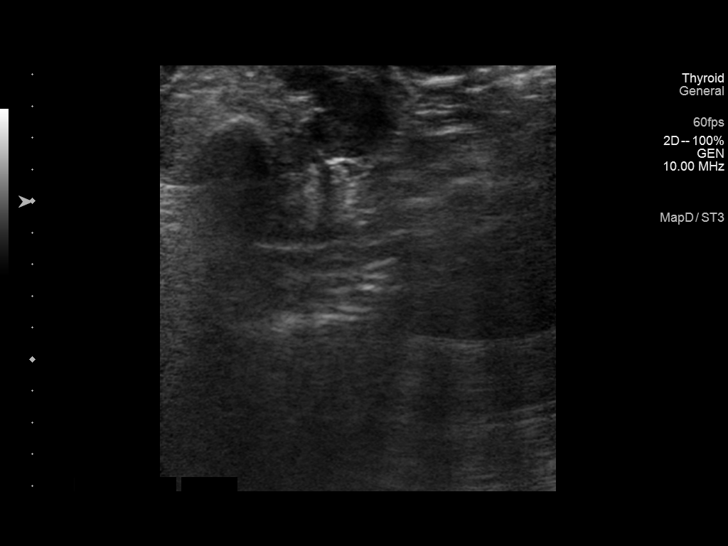
[im 7/7]
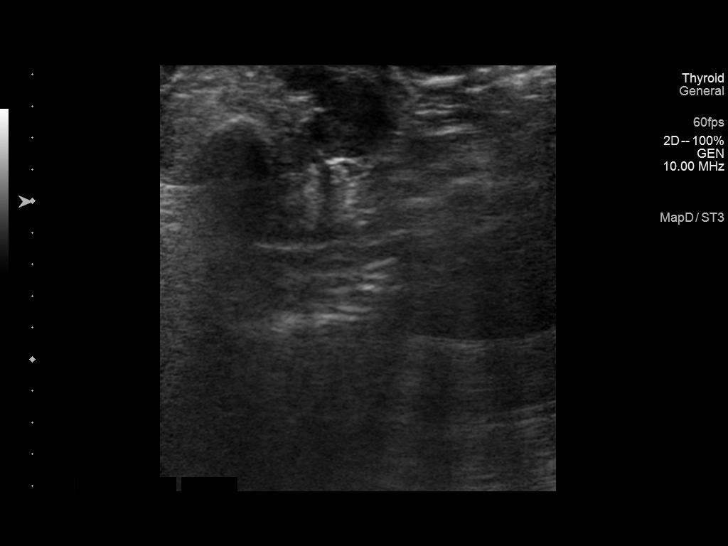

[14 of 15 positions shown; findings below may reference images not displayed]

FINDINGS: There is a 10 x 6 x 4 mm complex mass in the soft tissues at the
radial aspect of right index finger just proximal to the D IP joint.
The lesion is slightly lobulated but has well-defined margins. There
are tail like extensions extending dorsally and proximally. I
suspect this represents a ganglion cyst.

The appearance is not typical a giant cell tumor of the tendon
sheath. There is no internal vascularity. The lesion is immediately
superficial to vessels at the radial aspect of the index finger.
There is slight arthritis of the D IP joint. I do not detect a
discrete communication with the joint.
IMPRESSION: Probable complex ganglion cyst of the right index finger, likely
originating from the D IP joint.

## 2021-03-22 DIAGNOSIS — R062 Wheezing: Secondary | ICD-10-CM | POA: Diagnosis not present

## 2021-03-22 DIAGNOSIS — J449 Chronic obstructive pulmonary disease, unspecified: Secondary | ICD-10-CM | POA: Diagnosis not present

## 2021-04-11 DIAGNOSIS — D485 Neoplasm of uncertain behavior of skin: Secondary | ICD-10-CM | POA: Diagnosis not present

## 2021-04-11 DIAGNOSIS — J209 Acute bronchitis, unspecified: Secondary | ICD-10-CM | POA: Diagnosis not present

## 2021-04-22 DIAGNOSIS — J449 Chronic obstructive pulmonary disease, unspecified: Secondary | ICD-10-CM | POA: Diagnosis not present

## 2021-04-22 DIAGNOSIS — R062 Wheezing: Secondary | ICD-10-CM | POA: Diagnosis not present

## 2021-05-22 DIAGNOSIS — J449 Chronic obstructive pulmonary disease, unspecified: Secondary | ICD-10-CM | POA: Diagnosis not present

## 2021-05-22 DIAGNOSIS — R062 Wheezing: Secondary | ICD-10-CM | POA: Diagnosis not present

## 2021-06-22 DIAGNOSIS — J449 Chronic obstructive pulmonary disease, unspecified: Secondary | ICD-10-CM | POA: Diagnosis not present

## 2021-06-22 DIAGNOSIS — R062 Wheezing: Secondary | ICD-10-CM | POA: Diagnosis not present

## 2021-07-19 DIAGNOSIS — D485 Neoplasm of uncertain behavior of skin: Secondary | ICD-10-CM | POA: Diagnosis not present

## 2021-07-19 DIAGNOSIS — J209 Acute bronchitis, unspecified: Secondary | ICD-10-CM | POA: Diagnosis not present

## 2021-07-23 DIAGNOSIS — J449 Chronic obstructive pulmonary disease, unspecified: Secondary | ICD-10-CM | POA: Diagnosis not present

## 2021-07-23 DIAGNOSIS — R062 Wheezing: Secondary | ICD-10-CM | POA: Diagnosis not present

## 2021-07-28 DIAGNOSIS — J449 Chronic obstructive pulmonary disease, unspecified: Secondary | ICD-10-CM | POA: Diagnosis not present

## 2021-07-28 DIAGNOSIS — R062 Wheezing: Secondary | ICD-10-CM | POA: Diagnosis not present

## 2021-07-30 ENCOUNTER — Other Ambulatory Visit: Payer: Self-pay | Admitting: Emergency Medicine

## 2021-07-31 ENCOUNTER — Other Ambulatory Visit: Payer: Medicare HMO

## 2021-07-31 ENCOUNTER — Ambulatory Visit: Payer: Medicare HMO | Admitting: Hematology and Oncology

## 2021-08-11 ENCOUNTER — Other Ambulatory Visit: Payer: Self-pay | Admitting: Physician Assistant

## 2021-08-11 ENCOUNTER — Inpatient Hospital Stay: Payer: Medicare HMO | Attending: Physician Assistant

## 2021-08-11 ENCOUNTER — Inpatient Hospital Stay: Payer: Medicare HMO | Admitting: Physician Assistant

## 2021-08-11 ENCOUNTER — Telehealth: Payer: Self-pay

## 2021-08-11 ENCOUNTER — Other Ambulatory Visit: Payer: Self-pay

## 2021-08-11 VITALS — BP 110/92 | HR 105 | Temp 97.5°F | Resp 20 | Ht 69.0 in | Wt 141.5 lb

## 2021-08-11 DIAGNOSIS — E611 Iron deficiency: Secondary | ICD-10-CM | POA: Insufficient documentation

## 2021-08-11 DIAGNOSIS — R600 Localized edema: Secondary | ICD-10-CM | POA: Insufficient documentation

## 2021-08-11 DIAGNOSIS — D509 Iron deficiency anemia, unspecified: Secondary | ICD-10-CM

## 2021-08-11 DIAGNOSIS — M25572 Pain in left ankle and joints of left foot: Secondary | ICD-10-CM

## 2021-08-11 DIAGNOSIS — D649 Anemia, unspecified: Secondary | ICD-10-CM | POA: Insufficient documentation

## 2021-08-11 DIAGNOSIS — D500 Iron deficiency anemia secondary to blood loss (chronic): Secondary | ICD-10-CM | POA: Diagnosis not present

## 2021-08-11 LAB — CMP (CANCER CENTER ONLY)
ALT: 9 U/L (ref 0–44)
AST: 16 U/L (ref 15–41)
Albumin: 3.9 g/dL (ref 3.5–5.0)
Alkaline Phosphatase: 68 U/L (ref 38–126)
Anion gap: 7 (ref 5–15)
BUN: 17 mg/dL (ref 8–23)
CO2: 27 mmol/L (ref 22–32)
Calcium: 8.8 mg/dL — ABNORMAL LOW (ref 8.9–10.3)
Chloride: 105 mmol/L (ref 98–111)
Creatinine: 1.52 mg/dL — ABNORMAL HIGH (ref 0.61–1.24)
GFR, Estimated: 46 mL/min — ABNORMAL LOW (ref >60)
Glucose, Bld: 109 mg/dL — ABNORMAL HIGH (ref 70–99)
Potassium: 3.5 mmol/L (ref 3.5–5.1)
Sodium: 139 mmol/L (ref 135–145)
Total Bilirubin: 0.4 mg/dL (ref 0.3–1.2)
Total Protein: 6.2 g/dL — ABNORMAL LOW (ref 6.5–8.1)

## 2021-08-11 LAB — CBC WITH DIFFERENTIAL (CANCER CENTER ONLY)
Abs Immature Granulocytes: 0.02 10*3/uL (ref 0.00–0.07)
Basophils Absolute: 0.1 10*3/uL (ref 0.0–0.1)
Basophils Relative: 1 %
Eosinophils Absolute: 0.5 10*3/uL (ref 0.0–0.5)
Eosinophils Relative: 7 %
HCT: 37.8 % — ABNORMAL LOW (ref 39.0–52.0)
Hemoglobin: 12.8 g/dL — ABNORMAL LOW (ref 13.0–17.0)
Immature Granulocytes: 0 %
Lymphocytes Relative: 25 %
Lymphs Abs: 1.9 10*3/uL (ref 0.7–4.0)
MCH: 31.2 pg (ref 26.0–34.0)
MCHC: 33.9 g/dL (ref 30.0–36.0)
MCV: 92.2 fL (ref 80.0–100.0)
Monocytes Absolute: 0.7 10*3/uL (ref 0.1–1.0)
Monocytes Relative: 9 %
Neutro Abs: 4.6 10*3/uL (ref 1.7–7.7)
Neutrophils Relative %: 58 %
Platelet Count: 292 10*3/uL (ref 150–400)
RBC: 4.1 MIL/uL — ABNORMAL LOW (ref 4.22–5.81)
RDW: 12.8 % (ref 11.5–15.5)
WBC Count: 7.9 10*3/uL (ref 4.0–10.5)
nRBC: 0 % (ref 0.0–0.2)

## 2021-08-11 LAB — RETIC PANEL
Immature Retic Fract: 6.9 % (ref 2.3–15.9)
RBC.: 4.13 MIL/uL — ABNORMAL LOW (ref 4.22–5.81)
Retic Count, Absolute: 77.6 10*3/uL (ref 19.0–186.0)
Retic Ct Pct: 1.9 % (ref 0.4–3.1)
Reticulocyte Hemoglobin: 35.2 pg (ref >27.9)

## 2021-08-11 LAB — IRON AND IRON BINDING CAPACITY (CC-WL,HP ONLY)
Iron: 56 ug/dL (ref 45–182)
Saturation Ratios: 16 % — ABNORMAL LOW (ref 17.9–39.5)
TIBC: 347 ug/dL (ref 250–450)
UIBC: 291 ug/dL (ref 117–376)

## 2021-08-11 NOTE — Telephone Encounter (Signed)
Referral faxed to [facility].  [phone] ?Confirmation received ?

## 2021-08-11 NOTE — Progress Notes (Signed)
[place] Telephone:([phone]) [phone]   Fax:(336) [phone]  PROGRESS NOTE  Patient Care Team: Lorene Dy, MD as PCP - General (Internal Medicine)  Hematological/Oncological History #Normocytic Anemia #Iron Deficiency Anemia 1) Establish care with Dr. Lorenso Courier on 03/27/19 due to Hgb drop 14-12.2 per outside records 2) 03/27/2019: Hgb 11.5, WBC 6.6, Plt 301. Iron 70, TIBC 429, Sat 16% ferritin 16 3) 04/24/2019: Hgb 11.5, WBC 6.0, Plt 298. Iron 71, TIBC 369, Sat 19%, ferritin 15 4) 06/18/2019: EGD and colonoscopy performed. Revealed internal hemorrhoids and 3 mm polyp as well as dark stool throughout the colon.  5)  06/25/2019: Hgb 11.6, WBC 7.1, Plt 272 . Iron 57, TIBC 398, Sat 14%, Ferritin 22 6) 11/2019: received IV ferrous sucrose 229m x 5 doses 7) 01/14/2020: WBC 7.3, Hgb 11.8, Plt 231, Ferritin 290, Iron 87, TIBC 311, Sat 28% 8) 01/30/2021: WBC 7.4, Hgb 12.3, MCV 95.4, Plt 289, Ferritin 172, Iron 94, TIBC 290, Sat 32% 9) 08/11/2021: WBC 7.9, Hgb 12.8, MCV 92.2, Plt 292, Ferritin 94, Iron 56, TIBC 347, Sat 16%,    Interval History:  Tyler Iorio81y.o. male with medical history significant for iron deficiency anemia presents for a follow up visit. He was last seen on 8/292022. In the interim since his last visit he has had no hospitalizations or ED visits.  On exam today [name] accompanied by his brother.  He reports his energy levels are fairly stable.  He uses a wheelchair to ambulate.  He has a good appetite without any noticeable weight loss.  He denies any nausea, vomiting or abdominal pain.  He has intermittent episodes of constipation and diarrhea based on what he eats.  He denies easy bruising or signs of active bleeding.  Patient reports stable dyspnea on exertion and currently uses 4 L of supplemental oxygen.  Patient reports noticing swelling and discoloration at the joint of the third left toe.  He denies any pain or injury to the left toe.  He otherwise has  no other complaints and is doing well.  Full 10 point ROS is listed below.  MEDICAL HISTORY:  Past Medical History:  Diagnosis Date   Anemia    Arthritis    DDD, shoulder   Asthma    hayfever, seasonal allergies, + smoker, uses primatene pill on occas.    COPD (chronic obstructive pulmonary disease) (HCC)    GERD (gastroesophageal reflux disease)    uses Rolaids on rare occas.    Mass of finger    cystic mass right index finger   Postnasal drip    uses Benadryl & primatene as needed    Supplemental oxygen dependent    continuous   Wears dentures     SURGICAL HISTORY: Past Surgical History:  Procedure Laterality Date   BIOPSY  06/18/2019   Procedure: BIOPSY;  Surgeon: MIrving Copas, MD;  [facility];  Service: Gastroenterology;;   COLONOSCOPY WITH PROPOFOL N/A 06/18/2019   Procedure: COLONOSCOPY WITH PROPOFOL;  Surgeon: MIrving Copas, MD;  [place] [facility]  Service: Gastroenterology;  Laterality: N/A;   CYST EXCISION Right 12/18/2019   Procedure: EXCISION CYSTIC MASS WITH DISTAL INTERPHANLEGEAL JOINT ARTHROTOMY RIGHT INDEX FINGER;  Surgeon: KDaryll Brod MD;  [place] [facility]  Service: Orthopedics;  Laterality: Right;  IV REGIONAL FOREARM BLOCK   ESOPHAGOGASTRODUODENOSCOPY (EGD) WITH PROPOFOL N/A 06/18/2019   Procedure: ESOPHAGOGASTRODUODENOSCOPY (EGD) WITH PROPOFOL;  Surgeon: MRush LandmarkGTelford Nab, MD;  [place] [facility]  Service: Gastroenterology;  Laterality: N/A;  GANGLION CYST EXCISION Left    L hand    LUMBAR LAMINECTOMY/DECOMPRESSION MICRODISCECTOMY Left 10/02/2012   Procedure: LUMBAR LAMINECTOMY/DECOMPRESSION MICRODISCECTOMY 2 LEVELS;  Surgeon: Ophelia Charter, MD;  Location: [facility];  Service: Neurosurgery;  Laterality: Left;  Left Lumbar four-five Lumbar five sacral one diskectomy   PILONIDAL CYST EXCISION     POLYPECTOMY  06/18/2019   Procedure: POLYPECTOMY;  Surgeon: Mansouraty, Telford Nab., MD;  Location: [facility];   Service: Gastroenterology;;   SHOULDER ARTHROSCOPY  2008   Left   TONSILLECTOMY      ALLERGIES:  has No Known Allergies.  MEDICATIONS:  Current Outpatient Medications  Medication Sig Dispense Refill   albuterol (VENTOLIN HFA) 108 (90 Base) MCG/ACT inhaler Inhale 1-2 puffs into the lungs every 4 (four) hours as needed for wheezing or shortness of breath. 1 each 3   ALPRAZolam (XANAX) 0.25 MG tablet Take 0.25 mg by mouth 2 (two) times daily as needed for anxiety.     Ascorbic Acid (VITAMIN C PO) Take 1 tablet by mouth daily.     Calcium-Magnesium-Zinc (CAL-MAG-ZINC PO) Take 1 tablet by mouth daily.     Cholecalciferol (VITAMIN D3 PO) Take 1 capsule by mouth daily.     Cyanocobalamin (B-12 PO) Place 1 tablet under the tongue daily.      dextromethorphan-guaiFENesin (MUCINEX DM) 30-600 MG 12hr tablet Take 1 tablet by mouth daily as needed for cough.     diphenhydrAMINE (BENADRYL) 25 mg capsule Take 25-50 mg by mouth every 8 (eight) hours as needed for allergies or sleep.      docusate sodium 100 MG CAPS Take 100 mg by mouth 2 (two) times daily. 60 capsule 1   Ensure Plus (ENSURE PLUS) LIQD Take 237 mLs by mouth daily.     ePHEDrine-guaiFENesin 12.5-200 MG TABS Take 0.25 tablets by mouth daily as needed (for allergies).     ferrous sulfate 325 (65 FE) MG EC tablet TAKE 1 TABLET BY MOUTH DAILY WITH BREAKFAST. TAKE WITH ORANGE JUICE OF VITAMIN C TO HELP ABSORPTION. 90 tablet 1   ipratropium-albuterol (DUONEB) 0.5-2.5 (3) MG/3ML SOLN TAKE 3 MLS BY NEBULIZATION IN THE MORNING, AT NOON, IN THE EVENING, AND AT BEDTIME. 360 mL 2   meclizine (ANTIVERT) 25 MG tablet Take 25 mg by mouth 3 (three) times daily as needed for dizziness.     omeprazole (PRILOSEC) 20 MG capsule Take 1 capsule (20 mg total) by mouth as directed. 20 mg twice daily for 4 weeks then decrease to 20 mg daily. (Patient taking differently: Take 20 mg by mouth 2 (two) times daily before a meal.) 90 capsule 3   OXYGEN Inhale 2 L/min into  the lungs continuous.     PRESCRIPTION MEDICATION Inject into the skin. Iron infusion/ Dr. Lorenso Courier     sennosides-docusate sodium (SENOKOT-S) 8.6-50 MG tablet Take 2 tablets by mouth daily as needed for constipation.      sodium chloride (OCEAN) 0.65 % nasal spray Place 1 spray into the nose daily as needed for congestion.      tamsulosin (FLOMAX) 0.4 MG CAPS capsule Take 0.4 mg by mouth daily.     traMADol (ULTRAM) 50 MG tablet Take 50 mg by mouth every 6 (six) hours as needed for moderate pain.     triamcinolone cream (KENALOG) 0.1 % Apply 1 application topically 2 (two) times daily.     No current facility-administered medications for this visit.    REVIEW OF SYSTEMS:   Constitutional: ( - ) fevers, ( - )  chills , ( - ) night sweats Eyes: ( - ) blurriness of vision, ( - ) double vision, ( - ) watery eyes Ears, nose, mouth, throat, and face: ( - ) mucositis, ( - ) sore throat Respiratory: ( + ) cough, ( + ) dyspnea, ( + ) wheezes Cardiovascular: ( - ) palpitation, ( - ) chest discomfort, ( - ) lower extremity swelling Gastrointestinal:  ( - ) nausea, ( - ) heartburn, ( - ) change in bowel habits Skin: ( - ) abnormal skin rashes Lymphatics: ( - ) new lymphadenopathy, ( - ) easy bruising Neurological: ( - ) numbness, ( - ) tingling, ( - ) new weaknesses Behavioral/Psych: ( - ) mood change, ( - ) new changes  All other systems were reviewed with the patient and are negative.  PHYSICAL EXAMINATION: ECOG PERFORMANCE STATUS: 3 - Symptomatic, >50% confined to bed  Vitals:   08/11/21 1541  BP: (!) 110/92  Pulse: (!) 105  Resp: 20  Temp: (!) 97.5 F (36.4 C)  SpO2: 94%   Filed Weights   08/11/21 1541  Weight: 141 lb 8 oz (64.2 kg)    GENERAL: chronically ill appearing elderly Caucasian male in no distress and comfortable. In a wheelchair SKIN: skin color, texture, turgor are normal, no rashes or significant lesions EYES: conjunctiva are pink and non-injected, sclera clear LUNGS:  clear to auscultation and percussion with normal breathing effort HEART: regular rate & rhythm and no murmurs and no lower extremity edema Musculoskeletal: no cyanosis of digits and no clubbing. Edema and discoloration of 3rd left MTP joint with good capillary refill.  PSYCH: alert & oriented x 3, fluent speech NEURO: no focal motor/sensory deficits  LABORATORY DATA:  I have reviewed the data as listed Recent Results (from the past 2160 hour(s))  Retic Panel     Status: Abnormal   Collection Time: 08/11/21  3:21 PM  Result Value Ref Range   Retic Ct Pct 1.9 0.4 - 3.1 %   RBC. 4.13 (L) 4.22 - 5.81 MIL/uL   Retic Count, Absolute 77.6 19.0 - 186.0 K/uL   Immature Retic Fract 6.9 2.3 - 15.9 %   Reticulocyte Hemoglobin 35.2 >27.9 pg    Comment:        Given the high negative predictive value of a RET-He result > 32 pg iron deficiency is essentially excluded. If this patient is anemic other etiologies should be considered. Performed at [facility], [place] [address] [place], [place] [address]   CBC with Differential ([place] Only)     Status: Abnormal   Collection Time: 08/11/21  3:21 PM  Result Value Ref Range   WBC Count 7.9 4.0 - 10.5 K/uL   RBC 4.10 (L) 4.22 - 5.81 MIL/uL   Hemoglobin 12.8 (L) 13.0 - 17.0 g/dL   HCT 37.8 (L) 39.0 - 52.0 %   MCV 92.2 80.0 - 100.0 fL   MCH 31.2 26.0 - 34.0 pg   MCHC 33.9 30.0 - 36.0 g/dL   RDW 12.8 11.5 - 15.5 %   Platelet Count 292 150 - 400 K/uL   nRBC 0.0 0.0 - 0.2 %   Neutrophils Relative % 58 %   Neutro Abs 4.6 1.7 - 7.7 K/uL   Lymphocytes Relative 25 %   Lymphs Abs 1.9 0.7 - 4.0 K/uL   Monocytes Relative 9 %   Monocytes Absolute 0.7 0.1 - 1.0 K/uL   Eosinophils Relative 7 %   Eosinophils Absolute 0.5  0.0 - 0.5 K/uL   Basophils Relative 1 %   Basophils Absolute 0.1 0.0 - 0.1 K/uL   Immature Granulocytes 0 %   Abs Immature Granulocytes 0.02 0.00 - 0.07 K/uL    Comment: Performed at [facility], [place] [address], [place], [place] [address]    RADIOGRAPHIC STUDIES: No relevant radiographic studies.  ASSESSMENT & PLAN Tyler Horne is a 81 y.o. male who presents for f/u of a normocytic anemia and iron deficiency.    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  --Labs today show continued improvement of anemia with Hgb 12.8. Iron panel shows iron 56, saturation 16%, ferritin 94 --work up for alternative etiologies of his anemia were negative, included B12, folate, and TSH. May have some component of low Hgb 2/2 to renal dysfunction.  --patient has undergone evaluation with EGD and colonoscopy in Jan 2021 which showed internal hemorrhoids and 3 mm polyp, but no other overt sources of bleeding.  --continue iron pills 393m PO daily with a source of vitamin C.  --completed iron sucrose x 5 doses in June 2021 -- no clear indication for a bone marrow biopsy at this time.  --RTC in 6 months to continue monitoring   #Edema/discoloration of 3rd Left MTP joint: --Sent referral to podiatry for further workup.    All questions were answered. The patient knows to call the clinic with any problems, questions or concerns.  I have spent a total of 25 minutes minutes of face-to-face and non-face-to-face time, preparing to see the patient, performing a medically appropriate examination, counseling and educating the patient, ordering tests/procedures, referring and communicating with other health care professionals, documenting clinical information in the electronic health record, independently interpreting results and communicating results to the patient, and care coordination.   IDede QueryPA-C Dept of Hematology and ODennehotsoat [facility]Phone: [phone]  08/11/2021 3:51 PM

## 2021-08-14 ENCOUNTER — Telehealth: Payer: Self-pay

## 2021-08-14 ENCOUNTER — Telehealth: Payer: Self-pay | Admitting: Hematology and Oncology

## 2021-08-14 ENCOUNTER — Encounter: Payer: Self-pay | Admitting: Hematology and Oncology

## 2021-08-14 LAB — FERRITIN: Ferritin: 94 ng/mL (ref 24–336)

## 2021-08-14 NOTE — Telephone Encounter (Signed)
Scheduled per 3/10 los, message has been left with pt ?

## 2021-08-14 NOTE — Telephone Encounter (Signed)
Please notify patient that iron panel was reviewed. No need for IV iron at this time. Okay to continue iron pills once a day. We will see him back in 6 months ? ?Pt's brother, Camila Li, advised ? ? ?

## 2021-08-20 DIAGNOSIS — R062 Wheezing: Secondary | ICD-10-CM | POA: Diagnosis not present

## 2021-08-20 DIAGNOSIS — J449 Chronic obstructive pulmonary disease, unspecified: Secondary | ICD-10-CM | POA: Diagnosis not present

## 2021-08-21 ENCOUNTER — Ambulatory Visit (INDEPENDENT_AMBULATORY_CARE_PROVIDER_SITE_OTHER): Payer: Medicare HMO

## 2021-08-21 ENCOUNTER — Ambulatory Visit: Payer: Medicare HMO | Admitting: Podiatrist

## 2021-08-21 ENCOUNTER — Other Ambulatory Visit: Payer: Self-pay

## 2021-08-21 DIAGNOSIS — M79672 Pain in left foot: Secondary | ICD-10-CM

## 2021-08-21 DIAGNOSIS — M7752 Other enthesopathy of left foot: Secondary | ICD-10-CM

## 2021-08-21 DIAGNOSIS — M109 Gout, unspecified: Secondary | ICD-10-CM

## 2021-08-21 NOTE — Patient Instructions (Addendum)
Gout ?Gout is a condition that causes painful swelling of the joints. Gout is a type of inflammation of the joints (arthritis). This condition is caused by having too much uric acid in the body. Uric acid is a chemical that forms when the body breaks down substances called purines. Purines are important for building body proteins. ?When the body has too much uric acid, sharp crystals can form and build up inside the joints. This causes pain and swelling. Gout attacks can happen quickly and may be very painful (acute gout). Over time, the attacks can affect more joints and become more frequent (chronic gout). Gout can also cause uric acid to build up under the skin and inside the kidneys. ?What are the causes? ?This condition is caused by too much uric acid in your blood. This can happen because: ?Your kidneys do not remove enough uric acid from your blood. This is the most common cause. ?Your body makes too much uric acid. This can happen with some cancers and cancer treatments. It can also occur if your body is breaking down too many red blood cells (hemolytic anemia). ?You eat too many foods that are high in purines. These foods include organ meats and some seafood. Alcohol, especially beer, is also high in purines. ?A gout attack may be triggered by trauma or stress. ?What increases the risk? ?You are more likely to develop this condition if you: ?Have a family history of gout. ?Are male and middle-aged. ?Are male and have gone through menopause. ?Are obese. ?Frequently drink alcohol, especially beer. ?Are dehydrated. ?Lose weight too quickly. ?Have an organ transplant. ?Have lead poisoning. ?Take certain medicines, including aspirin, cyclosporine, diuretics, levodopa, and niacin. ?Have kidney disease. ?Have a skin condition called psoriasis. ?What are the signs or symptoms? ?An attack of acute gout happens quickly. It usually occurs in just one joint. The most common place is the big toe. Attacks often start  at night. Other joints that may be affected include joints of the feet, ankle, knee, fingers, wrist, or elbow. Symptoms of this condition may include: ?Severe pain. ?Warmth. ?Swelling. ?Stiffness. ?Tenderness. The affected joint may be very painful to touch. ?Shiny, red, or purple skin. ?Chills and fever. ?Chronic gout may cause symptoms more frequently. More joints may be involved. You may also have white or yellow lumps (tophi) on your hands or feet or in other areas near your joints. ?How is this diagnosed? ?This condition is diagnosed based on your symptoms, medical history, and physical exam. You may have tests, such as: ?Blood tests to measure uric acid levels. ?Removal of joint fluid with a thin needle (aspiration) to look for uric acid crystals. ?X-rays to look for joint damage. ?How is this treated? ?Treatment for this condition has two phases: treating an acute attack and preventing future attacks. Acute gout treatment may include medicines to reduce pain and swelling, including: ?NSAIDs. ?Steroids. These are strong anti-inflammatory medicines that can be taken by mouth (orally) or injected into a joint. ?Colchicine. This medicine relieves pain and swelling when it is taken soon after an attack. It can be given by mouth or through an IV. ?Preventive treatment may include: ?Daily use of smaller doses of NSAIDs or colchicine. ?Use of a medicine that reduces uric acid levels in your blood. ?Changes to your diet. You may need to see a dietitian about what to eat and drink to prevent gout. ?Follow these instructions at home: ?During a gout attack ? ?If directed, put ice on the affected area: ?  Put ice in a plastic bag. ?Place a towel between your skin and the bag. ?Leave the ice on for 20 minutes, 2-3 times a day. ?Raise (elevate) the affected joint above the level of your heart as often as possible. ?Rest the joint as much as possible. If the affected joint is in your leg, you may be given crutches to  use. ?Follow instructions from your health care provider about eating or drinking restrictions. ?Avoiding future gout attacks ?Follow a low-purine diet as told by your dietitian or health care provider. Avoid foods and drinks that are high in purines, including liver, kidney, anchovies, asparagus, herring, mushrooms, mussels, and beer. ?Maintain a healthy weight or lose weight if you are overweight. If you want to lose weight, talk with your health care provider. It is important that you do not lose weight too quickly. ?Start or maintain an exercise program as told by your health care provider. ?Eating and drinking ?Drink enough fluids to keep your urine pale yellow. ?If you drink alcohol: ?Limit how much you use to: ?0-1 drink a day for women. ?0-2 drinks a day for men. ?Be aware of how much alcohol is in your drink. In the [place] one drink equals one 12 oz bottle of beer (355 mL) one 5 oz glass of wine (148 mL), or one 1? oz glass of hard liquor (44 mL). ?General instructions ?Take over-the-counter and prescription medicines only as told by your health care provider. ?Do not drive or use heavy machinery while taking prescription pain medicine. ?Return to your normal activities as told by your health care provider. Ask your health care provider what activities are safe for you. ?Keep all follow-up visits as told by your health care provider. This is important. ?Contact a health care provider if you have: ?Another gout attack. ?Continuing symptoms of a gout attack after 10 days of treatment. ?Side effects from your medicines. ?Chills or a fever. ?Burning pain when you urinate. ?Pain in your lower back or belly. ?Get help right away if you: ?Have severe or uncontrolled pain. ?Cannot urinate. ?Summary ?Gout is painful swelling of the joints caused by inflammation. ?The most common site of pain is the big toe, but it can affect other joints in the body. ?Medicines and dietary changes can help to prevent and treat gout  attacks. ?This information is not intended to replace advice given to you by your health care provider. Make sure you discuss any questions you have with your health care provider. ?Document Revised: 11/29/2017 Document Reviewed: 12/11/2017 ?Elsevier Patient Education ? 2022 Elsevier Inc. ? ?

## 2021-08-24 ENCOUNTER — Encounter: Payer: Self-pay | Admitting: Podiatrist

## 2021-08-24 MED ORDER — DEXAMETHASONE SODIUM PHOSPHATE 120 MG/30ML IJ SOLN
4.0000 mg | Freq: Once | INTRAMUSCULAR | Status: AC
  Administered 2021-08-21: 4 mg via INTRA_ARTICULAR

## 2021-08-24 NOTE — Progress Notes (Signed)
?Chief Complaint  ?Patient presents with  ? Foot Pain  ?  Arthralgia of left foot   ?  ? ?HPI: Patient is 81 y.o. male who presents today for pain in purplish discoloration of the third digit of the left foot.  He relates the discoloration has been present for about 2 weeks and it is painful when any pressure is applied.  He is use over-the-counter pain medication as well as tramadol to help with the discomfort.  He presents today with his brother ? ?Patient Active Problem List  ? Diagnosis Date Noted  ? Iron deficiency anemia due to chronic blood loss 09/27/2019  ? Gastritis 08/08/2019  ? Change in bowel habit 05/07/2019  ? Normocytic anemia 05/07/2019  ? Iron deficiency anemia 05/07/2019  ? Chronic respiratory failure with hypoxia ([place]) 11/01/2017  ? COPD (chronic obstructive pulmonary disease) ([place]) 07/02/2016  ? Tobacco use disorder 07/02/2016  ? Allergic rhinitis 07/02/2016  ? HTN (hypertension) 07/02/2016  ? ? ?Current Outpatient Medications on File Prior to Visit  ?Medication Sig Dispense Refill  ? albuterol (VENTOLIN HFA) 108 (90 Base) MCG/ACT inhaler Inhale 1-2 puffs into the lungs every 4 (four) hours as needed for wheezing or shortness of breath. 1 each 3  ? ALPRAZolam (XANAX) 0.25 MG tablet Take 0.25 mg by mouth 2 (two) times daily as needed for anxiety.    ? Ascorbic Acid (VITAMIN C PO) Take 1 tablet by mouth daily.    ? Calcium-Magnesium-Zinc (CAL-MAG-ZINC PO) Take 1 tablet by mouth daily.    ? Cholecalciferol (VITAMIN D3 PO) Take 1 capsule by mouth daily.    ? Cyanocobalamin (B-12 PO) Place 1 tablet under the tongue daily.     ? dextromethorphan-guaiFENesin (MUCINEX DM) 30-600 MG 12hr tablet Take 1 tablet by mouth daily as needed for cough.    ? diphenhydrAMINE (BENADRYL) 25 mg capsule Take 25-50 mg by mouth every 8 (eight) hours as needed for allergies or sleep.     ? docusate sodium 100 MG CAPS Take 100 mg by mouth 2 (two) times daily. 60 capsule 1  ? Ensure Plus (ENSURE PLUS) LIQD Take 237 mLs by  mouth daily.    ? ePHEDrine-guaiFENesin 12.5-200 MG TABS Take 0.25 tablets by mouth daily as needed (for allergies).    ? ferrous sulfate 325 (65 FE) MG EC tablet TAKE 1 TABLET BY MOUTH DAILY WITH BREAKFAST. TAKE WITH ORANGE JUICE OF VITAMIN C TO HELP ABSORPTION. 90 tablet 1  ? ipratropium-albuterol (DUONEB) 0.5-2.5 (3) MG/3ML SOLN TAKE 3 MLS BY NEBULIZATION IN THE MORNING, AT NOON, IN THE EVENING, AND AT BEDTIME. 360 mL 2  ? meclizine (ANTIVERT) 25 MG tablet Take 25 mg by mouth 3 (three) times daily as needed for dizziness.    ? omeprazole (PRILOSEC) 20 MG capsule Take 1 capsule (20 mg total) by mouth as directed. 20 mg twice daily for 4 weeks then decrease to 20 mg daily. (Patient taking differently: Take 20 mg by mouth 2 (two) times daily before a meal.) 90 capsule 3  ? OXYGEN Inhale 2 L/min into the lungs continuous.    ? PRESCRIPTION MEDICATION Inject into the skin. Iron infusion/ Dr. Lorenso Courier    ? sennosides-docusate sodium (SENOKOT-S) 8.6-50 MG tablet Take 2 tablets by mouth daily as needed for constipation.     ? sodium chloride (OCEAN) 0.65 % nasal spray Place 1 spray into the nose daily as needed for congestion.     ? tamsulosin (FLOMAX) 0.4 MG CAPS capsule Take 0.4 mg by  mouth daily.    ? traMADol (ULTRAM) 50 MG tablet Take 50 mg by mouth every 6 (six) hours as needed for moderate pain.    ? triamcinolone cream (KENALOG) 0.1 % Apply 1 application topically 2 (two) times daily.    ? ?No current facility-administered medications on file prior to visit.  ? ? ?No Known Allergies ? ?Review of Systems ?No fevers, chills, nausea, muscle aches, no difficulty breathing, no calf pain, no chest pain or shortness of breath. ? ? ?Physical Exam ? ?GENERAL APPEARANCE: Alert, conversant. Appropriately groomed. No acute distress.  ? ?VASCULAR: Pedal pulses palpable DP and PT bilateral.  Capillary refill time is immediate to all digits,  Proximal to distal cooling it warm to warm.  Digital perfusion adequate.  ? ?NEUROLOGIC:  sensation is intact to 5.07 monofilament at 5/5 sites bilateral.  Light touch is intact bilateral, vibratory sensation intact bilateral ? ?MUSCULOSKELETAL: acceptable muscle strength, tone and stability bilateral.  No gross boney pedal deformities noted.  Purpleish discoloration of the left third digit is noted at the proximal interphalangeal joint.  The digit itself is in rectus alignment and position. ? ?DERMATOLOGIC: skin is warm, supple, and dry.  Discoloration of the left third digit is noted as stated above.  No open lesion is seen.  No interdigital maceration is noted.  No redness, no swelling, no sign of infection within the digit or digital nail is noted. ? ?X-ray evaluation: 3 views of the left foot are obtained.  No sign of fracture or dislocation is noted of the third digit.  Minimal contracture deformity at the proximal interphalangeal joint is seen.   ? ?Assessment  ? ?  ICD-10-CM   ?1. Left foot pain  M79.672 DG Foot Complete Left  ?  ?2. Capsulitis of toe of left foot  M77.52   ?  ?3. Gout involving toe of left foot, unspecified cause, unspecified chronicity  M10.9   ?  ? ? ? ?Plan ? ?Discussed exam, x-ray and clinical findings with the patient and his brother.  Discussed trying an injection of steroid around the toe to see if this would help with the inflammation and pain.  The patient wishes to proceed and I infiltrated 4 mg dexamethasone phosphate with Marcaine plain into the third toe of the left foot without complication.  He will see if this is beneficial and will call if concerns arise in the future. ?

## 2021-09-20 DIAGNOSIS — J449 Chronic obstructive pulmonary disease, unspecified: Secondary | ICD-10-CM | POA: Diagnosis not present

## 2021-09-20 DIAGNOSIS — R062 Wheezing: Secondary | ICD-10-CM | POA: Diagnosis not present

## 2021-10-18 DIAGNOSIS — D485 Neoplasm of uncertain behavior of skin: Secondary | ICD-10-CM | POA: Diagnosis not present

## 2021-10-18 DIAGNOSIS — J209 Acute bronchitis, unspecified: Secondary | ICD-10-CM | POA: Diagnosis not present

## 2021-10-20 DIAGNOSIS — J449 Chronic obstructive pulmonary disease, unspecified: Secondary | ICD-10-CM | POA: Diagnosis not present

## 2021-10-20 DIAGNOSIS — R062 Wheezing: Secondary | ICD-10-CM | POA: Diagnosis not present

## 2021-10-20 DIAGNOSIS — J209 Acute bronchitis, unspecified: Secondary | ICD-10-CM | POA: Diagnosis not present

## 2021-11-20 DIAGNOSIS — J449 Chronic obstructive pulmonary disease, unspecified: Secondary | ICD-10-CM | POA: Diagnosis not present

## 2021-11-20 DIAGNOSIS — R062 Wheezing: Secondary | ICD-10-CM | POA: Diagnosis not present

## 2021-11-22 DIAGNOSIS — I10 Essential (primary) hypertension: Secondary | ICD-10-CM | POA: Diagnosis not present

## 2021-11-22 DIAGNOSIS — Z1389 Encounter for screening for other disorder: Secondary | ICD-10-CM | POA: Diagnosis not present

## 2021-11-22 DIAGNOSIS — Z1331 Encounter for screening for depression: Secondary | ICD-10-CM | POA: Diagnosis not present

## 2021-11-22 DIAGNOSIS — R5383 Other fatigue: Secondary | ICD-10-CM | POA: Diagnosis not present

## 2021-11-22 DIAGNOSIS — Z131 Encounter for screening for diabetes mellitus: Secondary | ICD-10-CM | POA: Diagnosis not present

## 2021-11-22 DIAGNOSIS — M810 Age-related osteoporosis without current pathological fracture: Secondary | ICD-10-CM | POA: Diagnosis not present

## 2021-11-22 DIAGNOSIS — Z7289 Other problems related to lifestyle: Secondary | ICD-10-CM | POA: Diagnosis not present

## 2021-11-22 DIAGNOSIS — R42 Dizziness and giddiness: Secondary | ICD-10-CM | POA: Diagnosis not present

## 2021-11-22 DIAGNOSIS — I70219 Atherosclerosis of native arteries of extremities with intermittent claudication, unspecified extremity: Secondary | ICD-10-CM | POA: Diagnosis not present

## 2021-11-22 DIAGNOSIS — D485 Neoplasm of uncertain behavior of skin: Secondary | ICD-10-CM | POA: Diagnosis not present

## 2021-11-22 DIAGNOSIS — R002 Palpitations: Secondary | ICD-10-CM | POA: Diagnosis not present

## 2021-11-22 DIAGNOSIS — Z79899 Other long term (current) drug therapy: Secondary | ICD-10-CM | POA: Diagnosis not present

## 2021-11-22 DIAGNOSIS — H9113 Presbycusis, bilateral: Secondary | ICD-10-CM | POA: Diagnosis not present

## 2021-11-22 DIAGNOSIS — Z125 Encounter for screening for malignant neoplasm of prostate: Secondary | ICD-10-CM | POA: Diagnosis not present

## 2021-11-22 DIAGNOSIS — R1032 Left lower quadrant pain: Secondary | ICD-10-CM | POA: Diagnosis not present

## 2021-11-22 DIAGNOSIS — E7800 Pure hypercholesterolemia, unspecified: Secondary | ICD-10-CM | POA: Diagnosis not present

## 2021-11-22 DIAGNOSIS — E039 Hypothyroidism, unspecified: Secondary | ICD-10-CM | POA: Diagnosis not present

## 2021-11-22 DIAGNOSIS — Z0000 Encounter for general adult medical examination without abnormal findings: Secondary | ICD-10-CM | POA: Diagnosis not present

## 2021-11-22 DIAGNOSIS — E785 Hyperlipidemia, unspecified: Secondary | ICD-10-CM | POA: Diagnosis not present

## 2021-12-12 ENCOUNTER — Other Ambulatory Visit: Payer: Self-pay | Admitting: *Deleted

## 2021-12-12 MED ORDER — ALBUTEROL SULFATE HFA 108 (90 BASE) MCG/ACT IN AERS: 1.0000 | INHALATION_SPRAY | RESPIRATORY_TRACT | 3 refills | Status: DC | PRN

## 2021-12-20 DIAGNOSIS — J449 Chronic obstructive pulmonary disease, unspecified: Secondary | ICD-10-CM | POA: Diagnosis not present

## 2021-12-20 DIAGNOSIS — R062 Wheezing: Secondary | ICD-10-CM | POA: Diagnosis not present

## 2021-12-23 ENCOUNTER — Other Ambulatory Visit: Payer: Self-pay | Admitting: Emergency Medicine

## 2022-01-11 DIAGNOSIS — J209 Acute bronchitis, unspecified: Secondary | ICD-10-CM | POA: Diagnosis not present

## 2022-01-20 DIAGNOSIS — J449 Chronic obstructive pulmonary disease, unspecified: Secondary | ICD-10-CM | POA: Diagnosis not present

## 2022-01-20 DIAGNOSIS — R062 Wheezing: Secondary | ICD-10-CM | POA: Diagnosis not present

## 2022-01-23 ENCOUNTER — Other Ambulatory Visit: Payer: Self-pay | Admitting: Emergency Medicine

## 2022-01-25 ENCOUNTER — Telehealth: Payer: Self-pay | Admitting: Emergency Medicine

## 2022-01-25 MED ORDER — IPRATROPIUM-ALBUTEROL 0.5-2.5 (3) MG/3ML IN SOLN: 3.0000 mL | Freq: Four times a day (QID) | RESPIRATORY_TRACT | 2 refills | Status: DC

## 2022-01-25 NOTE — Telephone Encounter (Signed)
Called patient and advised him that I would send in refills of the Duoneb. He states that he is just famished of going back and fourth with doctors and different appointment that he doe snot have the energy to make another appointment. Nothing further needed

## 2022-02-08 DIAGNOSIS — R42 Dizziness and giddiness: Secondary | ICD-10-CM | POA: Diagnosis not present

## 2022-02-14 ENCOUNTER — Inpatient Hospital Stay: Payer: Medicare HMO | Admitting: Hematology and Oncology

## 2022-02-14 ENCOUNTER — Inpatient Hospital Stay: Payer: Medicare HMO

## 2022-02-17 ENCOUNTER — Telehealth: Payer: Self-pay | Admitting: Pulmonary Disease

## 2022-02-17 MED ORDER — ALBUTEROL SULFATE HFA 108 (90 BASE) MCG/ACT IN AERS: 1.0000 | INHALATION_SPRAY | RESPIRATORY_TRACT | 3 refills | Status: DC | PRN

## 2022-02-17 NOTE — Telephone Encounter (Signed)
Refilled albuterol x 3 [facility]

## 2022-02-20 DIAGNOSIS — J449 Chronic obstructive pulmonary disease, unspecified: Secondary | ICD-10-CM | POA: Diagnosis not present

## 2022-02-20 DIAGNOSIS — R062 Wheezing: Secondary | ICD-10-CM | POA: Diagnosis not present

## 2022-02-21 DIAGNOSIS — J209 Acute bronchitis, unspecified: Secondary | ICD-10-CM | POA: Diagnosis not present

## 2022-02-21 DIAGNOSIS — D485 Neoplasm of uncertain behavior of skin: Secondary | ICD-10-CM | POA: Diagnosis not present

## 2022-02-21 DIAGNOSIS — Z23 Encounter for immunization: Secondary | ICD-10-CM | POA: Diagnosis not present

## 2022-02-23 ENCOUNTER — Other Ambulatory Visit: Payer: Self-pay | Admitting: Hematology and Oncology

## 2022-02-23 ENCOUNTER — Other Ambulatory Visit: Payer: Self-pay

## 2022-02-23 ENCOUNTER — Inpatient Hospital Stay: Payer: Medicare HMO | Attending: Hematology and Oncology

## 2022-02-23 ENCOUNTER — Inpatient Hospital Stay (HOSPITAL_BASED_OUTPATIENT_CLINIC_OR_DEPARTMENT_OTHER): Payer: Medicare HMO | Admitting: Hematology and Oncology

## 2022-02-23 VITALS — BP 114/70 | HR 93 | Temp 97.9°F | Resp 16 | Wt 135.9 lb

## 2022-02-23 DIAGNOSIS — K469 Unspecified abdominal hernia without obstruction or gangrene: Secondary | ICD-10-CM | POA: Insufficient documentation

## 2022-02-23 DIAGNOSIS — D649 Anemia, unspecified: Secondary | ICD-10-CM

## 2022-02-23 DIAGNOSIS — D509 Iron deficiency anemia, unspecified: Secondary | ICD-10-CM

## 2022-02-23 DIAGNOSIS — K219 Gastro-esophageal reflux disease without esophagitis: Secondary | ICD-10-CM | POA: Insufficient documentation

## 2022-02-23 DIAGNOSIS — J449 Chronic obstructive pulmonary disease, unspecified: Secondary | ICD-10-CM | POA: Diagnosis not present

## 2022-02-23 DIAGNOSIS — D500 Iron deficiency anemia secondary to blood loss (chronic): Secondary | ICD-10-CM

## 2022-02-23 LAB — CMP (CANCER CENTER ONLY)
ALT: 10 U/L (ref 0–44)
AST: 16 U/L (ref 15–41)
Albumin: 4 g/dL (ref 3.5–5.0)
Alkaline Phosphatase: 75 U/L (ref 38–126)
Anion gap: 5 (ref 5–15)
BUN: 11 mg/dL (ref 8–23)
CO2: 31 mmol/L (ref 22–32)
Calcium: 9 mg/dL (ref 8.9–10.3)
Chloride: 103 mmol/L (ref 98–111)
Creatinine: 1.3 mg/dL — ABNORMAL HIGH (ref 0.61–1.24)
GFR, Estimated: 55 mL/min — ABNORMAL LOW (ref >60)
Glucose, Bld: 116 mg/dL — ABNORMAL HIGH (ref 70–99)
Potassium: 3.6 mmol/L (ref 3.5–5.1)
Sodium: 139 mmol/L (ref 135–145)
Total Bilirubin: 0.5 mg/dL (ref 0.3–1.2)
Total Protein: 6.6 g/dL (ref 6.5–8.1)

## 2022-02-23 LAB — CBC WITH DIFFERENTIAL (CANCER CENTER ONLY)
Abs Immature Granulocytes: 0.02 10*3/uL (ref 0.00–0.07)
Basophils Absolute: 0.1 10*3/uL (ref 0.0–0.1)
Basophils Relative: 1 %
Eosinophils Absolute: 0.4 10*3/uL (ref 0.0–0.5)
Eosinophils Relative: 5 %
HCT: 43 % (ref 39.0–52.0)
Hemoglobin: 14.3 g/dL (ref 13.0–17.0)
Immature Granulocytes: 0 %
Lymphocytes Relative: 19 %
Lymphs Abs: 1.4 10*3/uL (ref 0.7–4.0)
MCH: 31.2 pg (ref 26.0–34.0)
MCHC: 33.3 g/dL (ref 30.0–36.0)
MCV: 93.9 fL (ref 80.0–100.0)
Monocytes Absolute: 0.6 10*3/uL (ref 0.1–1.0)
Monocytes Relative: 8 %
Neutro Abs: 5 10*3/uL (ref 1.7–7.7)
Neutrophils Relative %: 67 %
Platelet Count: 281 10*3/uL (ref 150–400)
RBC: 4.58 MIL/uL (ref 4.22–5.81)
RDW: 12.6 % (ref 11.5–15.5)
WBC Count: 7.6 10*3/uL (ref 4.0–10.5)
nRBC: 0 % (ref 0.0–0.2)

## 2022-02-23 LAB — IRON AND IRON BINDING CAPACITY (CC-WL,HP ONLY)
Iron: 65 ug/dL (ref 45–182)
Saturation Ratios: 20 % (ref 17.9–39.5)
TIBC: 325 ug/dL (ref 250–450)
UIBC: 260 ug/dL (ref 117–376)

## 2022-02-23 LAB — RETIC PANEL
Immature Retic Fract: 6.4 % (ref 2.3–15.9)
RBC.: 4.6 MIL/uL (ref 4.22–5.81)
Retic Count, Absolute: 71.3 10*3/uL (ref 19.0–186.0)
Retic Ct Pct: 1.6 % (ref 0.4–3.1)
Reticulocyte Hemoglobin: 34.9 pg (ref >27.9)

## 2022-02-23 LAB — FERRITIN: Ferritin: 65 ng/mL (ref 24–336)

## 2022-02-23 NOTE — Progress Notes (Signed)
[facility] [phone]   [phone]  PROGRESS NOTE  Patient Care Team: Roberts, Ronald, MD as PCP - General (Internal Medicine)  Hematological/Oncological History #Normocytic Anemia #Iron Deficiency Anemia 1) Establish care with Dr. Dorsey on 03/27/19 due to Hgb drop 14-12.2 per outside records 2) 03/27/2019: Hgb 11.5, WBC 6.6, Plt 301. Iron 70, TIBC 429, Sat 16% ferritin 16 3) 04/24/2019: Hgb 11.5, WBC 6.0, Plt 298. Iron 71, TIBC 369, Sat 19%, ferritin 15 4) 06/18/2019: EGD and colonoscopy performed. Revealed internal hemorrhoids and 3 mm polyp as well as dark stool throughout the colon.  5)  06/25/2019: Hgb 11.6, WBC 7.1, Plt 272 . Iron 57, TIBC 398, Sat 14%, Ferritin 22 6) 11/2019: received IV ferrous sucrose 200mg x 5 doses 7) 01/14/2020: WBC 7.3, Hgb 11.8, Plt 231, Iron/Ferritin pending.  8) 01/30/2021: WBC 7.4, Hgb 12.3, MCV 95.4, Plt 289  Interval History:  Tyler Horne 81 y.o. male with medical history significant for iron deficiency anemia presents for a follow up visit. He was last seen on 01/30/2021. In the interim since his last visit he has had no hospitalizations or ED visits.  On exam today Tyler Horne is accompanied by his brother.  He reports that he has abdominal hernia which is easily reducible.  Is not causing him any pain or distress.  Reports that he does have COPD only because it increases pressure in his belly and causes the hernia to push out.  He reports he is taking his tamsulosin and he is still enjoying his Mountain Dew.  He notes he has been trying to eat better lately and his brother's wife has been cooking for him and bring him food from [employer] cafeteria.  He notes that he is taking Dulcolax and his stool is moving well.  He continues to take iron pills without any difficulty.  He notes that sometimes his stool is "spinach colored but not black.  He otherwise denies any overt signs of bleeding, bruising, or dark stools.  A full 10 point  ROS is listed below.  MEDICAL HISTORY:  Past Medical History:  Diagnosis Date   Anemia    Arthritis    DDD, shoulder   Asthma    hayfever, seasonal allergies, + smoker, uses primatene pill on occas.    COPD (chronic obstructive pulmonary disease) (HCC)    GERD (gastroesophageal reflux disease)    uses Rolaids on rare occas.    Mass of finger    cystic mass right index finger   Postnasal drip    uses Benadryl & primatene as needed    Supplemental oxygen dependent    continuous   Wears dentures     SURGICAL HISTORY: Past Surgical History:  Procedure Laterality Date   BIOPSY  06/18/2019   Procedure: BIOPSY;  Surgeon: Mansouraty, Gabriel Jr., MD;  Location: [facility];  Service: Gastroenterology;;   COLONOSCOPY WITH PROPOFOL N/A 06/18/2019   Procedure: COLONOSCOPY WITH PROPOFOL;  Surgeon: Mansouraty, Gabriel Jr., MD;  Location: [facility];  Service: Gastroenterology;  Laterality: N/A;   CYST EXCISION Right 12/18/2019   Procedure: EXCISION CYSTIC MASS WITH DISTAL INTERPHANLEGEAL JOINT ARTHROTOMY RIGHT INDEX FINGER;  Surgeon: Kuzma, Gary, MD;  Location: [facility] OR;  Service: Orthopedics;  Laterality: Right;  IV REGIONAL FOREARM BLOCK   ESOPHAGOGASTRODUODENOSCOPY (EGD) WITH PROPOFOL N/A 06/18/2019   Procedure: ESOPHAGOGASTRODUODENOSCOPY (EGD) WITH PROPOFOL;  Surgeon: Mansouraty, Gabriel Jr., MD;  Location: [facility];  Service: Gastroenterology;  Laterality: N/A;   GANGLION CYST EXCISION Left      L hand    LUMBAR LAMINECTOMY/DECOMPRESSION MICRODISCECTOMY Left 10/02/2012   Procedure: LUMBAR LAMINECTOMY/DECOMPRESSION MICRODISCECTOMY 2 LEVELS;  Surgeon: Jeffrey D Jenkins, MD;  Location: [facility];  Service: Neurosurgery;  Laterality: Left;  Left Lumbar four-five Lumbar five sacral one diskectomy   PILONIDAL CYST EXCISION     POLYPECTOMY  06/18/2019   Procedure: POLYPECTOMY;  Surgeon: Mansouraty, Gabriel Jr., MD;  Location: [facility];  Service: Gastroenterology;;   SHOULDER ARTHROSCOPY   2008   Left   TONSILLECTOMY      ALLERGIES:  has No Known Allergies.  MEDICATIONS:  Current Outpatient Medications  Medication Sig Dispense Refill   albuterol (VENTOLIN HFA) 108 (90 Base) MCG/ACT inhaler Inhale 1-2 puffs into the lungs every 4 (four) hours as needed for wheezing or shortness of breath. 18 g 3   ALPRAZolam (XANAX) 0.25 MG tablet Take 0.25 mg by mouth 2 (two) times daily as needed for anxiety.     Ascorbic Acid (VITAMIN C PO) Take 1 tablet by mouth daily.     Calcium-Magnesium-Zinc (CAL-MAG-ZINC PO) Take 1 tablet by mouth daily.     Cholecalciferol (VITAMIN D3 PO) Take 1 capsule by mouth daily.     Cyanocobalamin (B-12 PO) Place 1 tablet under the tongue daily.      dextromethorphan-guaiFENesin (MUCINEX DM) 30-600 MG 12hr tablet Take 1 tablet by mouth daily as needed for cough.     diphenhydrAMINE (BENADRYL) 25 mg capsule Take 25-50 mg by mouth every 8 (eight) hours as needed for allergies or sleep.      docusate sodium 100 MG CAPS Take 100 mg by mouth 2 (two) times daily. 60 capsule 1   Ensure Plus (ENSURE PLUS) LIQD Take 237 mLs by mouth daily.     ePHEDrine-guaiFENesin 12.5-200 MG TABS Take 0.25 tablets by mouth daily as needed (for allergies).     ferrous sulfate 325 (65 FE) MG EC tablet TAKE 1 TABLET BY MOUTH DAILY WITH BREAKFAST. TAKE WITH ORANGE JUICE OF VITAMIN C TO HELP ABSORPTION. 90 tablet 1   ipratropium-albuterol (DUONEB) 0.5-2.5 (3) MG/3ML SOLN Take 3 mLs by nebulization in the morning, at noon, in the evening, and at bedtime. 360 mL 2   meclizine (ANTIVERT) 25 MG tablet Take 25 mg by mouth 3 (three) times daily as needed for dizziness.     omeprazole (PRILOSEC) 20 MG capsule Take 1 capsule (20 mg total) by mouth as directed. 20 mg twice daily for 4 weeks then decrease to 20 mg daily. (Patient taking differently: Take 20 mg by mouth 2 (two) times daily before a meal.) 90 capsule 3   OXYGEN Inhale 2 L/min into the lungs continuous.     PRESCRIPTION MEDICATION  Inject into the skin. Iron infusion/ Dr. Dorsey     sennosides-docusate sodium (SENOKOT-S) 8.6-50 MG tablet Take 2 tablets by mouth daily as needed for constipation.      sodium chloride (OCEAN) 0.65 % nasal spray Place 1 spray into the nose daily as needed for congestion.      tamsulosin (FLOMAX) 0.4 MG CAPS capsule Take 0.4 mg by mouth daily.     traMADol (ULTRAM) 50 MG tablet Take 50 mg by mouth every 6 (six) hours as needed for moderate pain.     triamcinolone cream (KENALOG) 0.1 % Apply 1 application topically 2 (two) times daily.     No current facility-administered medications for this visit.    REVIEW OF SYSTEMS:   Constitutional: ( - ) fevers, ( - )  chills , ( - )   night sweats Eyes: ( - ) blurriness of vision, ( - ) double vision, ( - ) watery eyes Ears, nose, mouth, throat, and face: ( - ) mucositis, ( - ) sore throat Respiratory: ( + ) cough, ( + ) dyspnea, ( + ) wheezes Cardiovascular: ( - ) palpitation, ( - ) chest discomfort, ( - ) lower extremity swelling Gastrointestinal:  ( - ) nausea, ( - ) heartburn, ( - ) change in bowel habits Skin: ( - ) abnormal skin rashes Lymphatics: ( - ) new lymphadenopathy, ( - ) easy bruising Neurological: ( - ) numbness, ( - ) tingling, ( - ) new weaknesses Behavioral/Psych: ( - ) mood change, ( - ) new changes  All other systems were reviewed with the patient and are negative.  PHYSICAL EXAMINATION: ECOG PERFORMANCE STATUS: 3 - Symptomatic, >50% confined to bed  Vitals:   02/23/22 1423  BP: 114/70  Pulse: 93  Resp: 16  Temp: 97.9 F (36.6 C)  SpO2: 98%   Filed Weights   02/23/22 1423  Weight: 135 lb 14.4 oz (61.6 kg)    GENERAL: chronically ill appearing elderly Caucasian male in no distress and comfortable. In a wheelchair SKIN: skin color, texture, turgor are normal, no rashes or significant lesions EYES: conjunctiva are pink and non-injected, sclera clear LUNGS: clear to auscultation and percussion with normal breathing  effort HEART: regular rate & rhythm and no murmurs and no lower extremity edema Musculoskeletal: no cyanosis of digits and no clubbing  PSYCH: alert & oriented x 3, fluent speech NEURO: no focal motor/sensory deficits  LABORATORY DATA:  I have reviewed the data as listed Recent Results (from the past 2160 hour(s))  Retic Panel     Status: None   Collection Time: 02/23/22  1:11 PM  Result Value Ref Range   Retic Ct Pct 1.6 0.4 - 3.1 %   RBC. 4.60 4.22 - 5.81 MIL/uL   Retic Count, Absolute 71.3 19.0 - 186.0 K/uL   Immature Retic Fract 6.4 2.3 - 15.9 %   Reticulocyte Hemoglobin 34.9 >27.9 pg    Comment:        Given the high negative predictive value of a RET-He result > 32 pg iron deficiency is essentially excluded. If this patient is anemic other etiologies should be considered. Performed at [facility], [address] [place], [place] [address]   Iron and Iron Binding Capacity ([facility] only)     Status: None   Collection Time: 02/23/22  1:11 PM  Result Value Ref Range   Iron 65 45 - 182 ug/dL   TIBC 325 250 - 450 ug/dL   Saturation Ratios 20 17.9 - 39.5 %   UIBC 260 117 - 376 ug/dL    Comment: Performed at  [facility], [address] [place],  [address]  CMP ([facility] only)     Status: Abnormal   Collection Time: 02/23/22  1:11 PM  Result Value Ref Range   Sodium 139 135 - 145 mmol/L   Potassium 3.6 3.5 - 5.1 mmol/L   Chloride 103 98 - 111 mmol/L   CO2 31 22 - 32 mmol/L   Glucose, Bld 116 (H) 70 - 99 mg/dL    Comment: Glucose reference range applies only to samples taken after fasting for at least 8 hours.   BUN 11 8 - 23 mg/dL   Creatinine 1.30 (H) 0.61 - 1.24 mg/dL   Calcium 9.0 8.9 - 10.3 mg/dL     Total Protein 6.6 6.5 - 8.1 g/dL   Albumin 4.0 3.5 - 5.0 g/dL   AST 16 15 - 41 U/L   ALT 10 0 - 44 U/L   Alkaline Phosphatase 75 38 - 126 U/L   Total Bilirubin 0.5 0.3 - 1.2 mg/dL   GFR, Estimated 55  (L) >60 mL/min    Comment: (NOTE) Calculated using the CKD-EPI Creatinine Equation (2021)    Anion gap 5 5 - 15    Comment: Performed at [facility], [place] [address], [place], [place] [address]  CBC with Differential ([facility] Only)     Status: None   Collection Time: 02/23/22  1:11 PM  Result Value Ref Range   WBC Count 7.6 4.0 - 10.5 K/uL   RBC 4.58 4.22 - 5.81 MIL/uL   Hemoglobin 14.3 13.0 - 17.0 g/dL   HCT 43.0 39.0 - 52.0 %   MCV 93.9 80.0 - 100.0 fL   MCH 31.2 26.0 - 34.0 pg   MCHC 33.3 30.0 - 36.0 g/dL   RDW 12.6 11.5 - 15.5 %   Platelet Count 281 150 - 400 K/uL   nRBC 0.0 0.0 - 0.2 %   Neutrophils Relative % 67 %   Neutro Abs 5.0 1.7 - 7.7 K/uL   Lymphocytes Relative 19 %   Lymphs Abs 1.4 0.7 - 4.0 K/uL   Monocytes Relative 8 %   Monocytes Absolute 0.6 0.1 - 1.0 K/uL   Eosinophils Relative 5 %   Eosinophils Absolute 0.4 0.0 - 0.5 K/uL   Basophils Relative 1 %   Basophils Absolute 0.1 0.0 - 0.1 K/uL   Immature Granulocytes 0 %   Abs Immature Granulocytes 0.02 0.00 - 0.07 K/uL    Comment: Performed at [facility], [place] [address], [place], [place] [address]  Ferritin     Status: None   Collection Time: 02/23/22  1:12 PM  Result Value Ref Range   Ferritin 65 24 - 336 ng/mL    Comment: Performed at [facility], [place], [place] [address]    RADIOGRAPHIC STUDIES: No relevant radiographic studies.  ASSESSMENT & PLAN Tyler Horne 81 y.o. male with medical history significant for COPD on home O2, GERD, and postnasal drip who presents for f/u of a normocytic anemia and iron deficiency.   Labs today are consistent with continued mild anemia.  On his initial visit he was recommended to start on iron 325 mg p.o. daily with a source of vitamin C. given the relative stability of his iron levels and hemoglobin I was concerned that the patient was not necessarily taking his iron as  prescribed (his clinic visits are often tangential and difficult to focus, unclear if some component of cognitive decline).   Fortunately the patient has undergone GI evaluation with endoscopy and colonoscopy.  Colonoscopy was significant for internal hemorrhoids, but no overt source of bleeding was discovered.  Upper endoscopy revealed no clear sources of bleeding as well.  It was noted that if the patient continues to be iron deficient capsule endoscopy could be considered.   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  --Hgb today improved today at 14.3 --work up for alternative etiologies of his anemia were negative, included B12, folate, and TSH. May have some component of low Hgb 2/2 to renal dysfunction.  --patient has undergone evaluation with EGD and colonoscopy in Jan 2021  which showed internal hemorrhoids and 3 mm polyp, but no other overt sources of bleeding.  --continue iron pills 31m PO daily with a source of vitamin C. Re-emphasized the proper way to take these pills today.  --completed iron sucrose x 5 doses in June 2021 -- no clear indication for a bone marrow biopsy at this time.  --RTC in 6 months to continue monitoring   All questions were answered. The patient knows to call the clinic with any problems, questions or concerns.  A total of more than 30 minutes were spent face-to-face with the patient during this encounter and over half of that time was spent on counseling and coordination of care as outlined above.   JLedell Peoples MD Department of Hematology/Oncology [facility] SystemPhone: [phone]: [phone]: [email or web address]  02/26/2022 8:17 AM

## 2022-02-26 ENCOUNTER — Encounter: Payer: Self-pay | Admitting: Hematology and Oncology

## 2022-02-27 ENCOUNTER — Other Ambulatory Visit: Payer: Self-pay | Admitting: Emergency Medicine

## 2022-03-22 DIAGNOSIS — J449 Chronic obstructive pulmonary disease, unspecified: Secondary | ICD-10-CM | POA: Diagnosis not present

## 2022-03-22 DIAGNOSIS — R062 Wheezing: Secondary | ICD-10-CM | POA: Diagnosis not present

## 2022-03-27 ENCOUNTER — Ambulatory Visit: Payer: Medicare HMO | Admitting: Emergency Medicine

## 2022-03-27 ENCOUNTER — Encounter: Payer: Self-pay | Admitting: Emergency Medicine

## 2022-03-27 DIAGNOSIS — J449 Chronic obstructive pulmonary disease, unspecified: Secondary | ICD-10-CM

## 2022-03-27 DIAGNOSIS — F17200 Nicotine dependence, unspecified, uncomplicated: Secondary | ICD-10-CM

## 2022-03-27 DIAGNOSIS — J9611 Chronic respiratory failure with hypoxia: Secondary | ICD-10-CM

## 2022-03-27 MED ORDER — ALBUTEROL SULFATE (2.5 MG/3ML) 0.083% IN NEBU: 2.5000 mg | INHALATION_SOLUTION | Freq: Four times a day (QID) | RESPIRATORY_TRACT | 12 refills | Status: DC | PRN

## 2022-03-27 NOTE — Progress Notes (Signed)
Subjective:    Patient ID: Tyler Horne, male    DOB: 1941-05-28, 81 y.o.   MRN: [id number]  HPI  ROV 12/17/19 --follow-up visit for 81 year old man with history of tobacco use and very severe COPD. He quit smoking in April 2021.  He also has GERD and chronic rhinitis, documented hypoxemic respiratory failure.  He is using oxygen at 2 L/min, has been shown to desaturate on this in the past but appears to be tolerating today. He has a pulsed system. He is using DuoNeb as needed, but he doesn't take the whole treatment, leaves the remainder for later. He is not interested in taking a full treatment on the schedule I've prescribed. He will still sometimes use Primatene mist. No flares, no ED visits since last time. He has been treated for Fe def anemia, has a negative CSY and EGD.   ROV 01/18/21 --81 year old man with a history of tobacco use and very severe COPD.  He is back to smoking a few cigarettes weekly.  He has associated exertional hypoxemic respiratory failure and uses oxygen at 2 L/min pulsed. He uses DuoNeb, mainly as needed as opposed to on a schedule. He is still using Duoneb, not the entire treatment, about 10 x a day. Averages 5 vials a day.  No flares since last time. No abx or pred.  Has not had any COVID vaccine - planning to do so today.   ROV 03/27/22 --follow-up visit for 81 year old man with history of tobacco use and very severe COPD.  He still smokes a few cigarettes a week.  He has been managed on DuoNeb in the past.  Have discussed with him scheduled regimen but typically uses as needed. He is very concerned that he is not getting enough Duo/Neb. There have been shortages.  He has had his flu shot. Not planning to get covid shot this year.    Review of Systems  Constitutional: Negative.  Negative for fever and unexpected weight change.  HENT:  Positive for postnasal drip and rhinorrhea. Negative for congestion, dental problem, ear pain, nosebleeds, sinus pressure, sneezing,  sore throat and trouble swallowing.   Eyes: Negative.  Negative for redness and itching.  Respiratory:  Positive for cough and shortness of breath. Negative for chest tightness and wheezing.   Cardiovascular: Negative.  Negative for palpitations and leg swelling.  Gastrointestinal: Negative.  Negative for nausea and vomiting.  Genitourinary: Negative.  Negative for dysuria.  Musculoskeletal: Negative.  Negative for joint swelling.  Skin: Negative.  Negative for rash.  Neurological: Negative.  Negative for headaches.  Hematological: Negative.  Does not bruise/bleed easily.  Psychiatric/Behavioral: Negative.  Negative for dysphoric mood. The patient is not nervous/anxious.        Objective:   Physical Exam Vitals:   03/27/22 1443  BP: 110/70  Pulse: (!) 101  Temp: 97.9 F (36.6 C)  SpO2: 93%  Weight: 139 lb 3.2 oz (63.1 kg)  Height: '5\' 5"'$  (1.651 m)    Gen: Pleasant, thin with some temporal wasting, in no distress,  normal affect  ENT: No lesions,  mouth clear,  oropharynx clear, no postnasal drip  Neck: No JVD, no stridor  Lungs: No use of accessory muscles, distant and decreased throughout, few exp wheezes.   Cardiovascular: RRR, heart sounds normal, no murmur or gallops, no peripheral edema  Musculoskeletal: No deformities, no cyanosis or clubbing  Neuro: alert, non focal  Skin: Warm, no lesions or rashes     Assessment & Plan:  COPD (  chronic obstructive pulmonary disease) (HCC) We will continue your albuterol/ipratropium (DuoNeb) nebulizer treatments 4 times a day on a schedule.  We will confirm that you are prescription is adequate to make sure that you can stay on this regimen with adequate supply from the pharmacy. We will write a prescription for albuterol nebs.  You can use 1 nebulizer treatment up to every 4 hours if needed for shortness of breath, chest tightness, wheezing. Continue oxygen as you have been using it Flu shot is up-to-date You would benefit  from getting the COVID-19 vaccine this fall Follow with Dr. Lamonte Sakai in 12 months or sooner if you have any problems.   Chronic respiratory failure with hypoxia (HCC) Continue same oxygen at all times.  Tobacco use disorder Work hard on stopping all smoking   Baltazar Apo, MD, PhD 03/27/2022, 3:07 PM [facility] [phone] or if no answer [phone]

## 2022-03-27 NOTE — Assessment & Plan Note (Signed)
Work hard on stopping all smoking

## 2022-03-27 NOTE — Assessment & Plan Note (Signed)
Continue same oxygen at all times.

## 2022-03-27 NOTE — Assessment & Plan Note (Signed)
We will continue your albuterol/ipratropium (DuoNeb) nebulizer treatments 4 times a day on a schedule.  We will confirm that you are prescription is adequate to make sure that you can stay on this regimen with adequate supply from the pharmacy. We will write a prescription for albuterol nebs.  You can use 1 nebulizer treatment up to every 4 hours if needed for shortness of breath, chest tightness, wheezing. Continue oxygen as you have been using it Flu shot is up-to-date You would benefit from getting the COVID-19 vaccine this fall Follow with Dr. Lamonte Sakai in 12 months or sooner if you have any problems.

## 2022-03-27 NOTE — Patient Instructions (Signed)
We will continue your albuterol/ipratropium (DuoNeb) nebulizer treatments 4 times a day on a schedule.  We will confirm that you are prescription is adequate to make sure that you can stay on this regimen with adequate supply from the pharmacy. We will write a prescription for albuterol nebs.  You can use 1 nebulizer treatment up to every 4 hours if needed for shortness of breath, chest tightness, wheezing. Continue oxygen as you have been using it Flu shot is up-to-date You would benefit from getting the COVID-19 vaccine this fall Work hard on stopping all smoking Follow with Dr. Lamonte Sakai in 12 months or sooner if you have any problems.

## 2022-04-22 DIAGNOSIS — J449 Chronic obstructive pulmonary disease, unspecified: Secondary | ICD-10-CM | POA: Diagnosis not present

## 2022-04-22 DIAGNOSIS — R062 Wheezing: Secondary | ICD-10-CM | POA: Diagnosis not present

## 2022-05-22 DIAGNOSIS — R062 Wheezing: Secondary | ICD-10-CM | POA: Diagnosis not present

## 2022-05-22 DIAGNOSIS — J449 Chronic obstructive pulmonary disease, unspecified: Secondary | ICD-10-CM | POA: Diagnosis not present

## 2022-05-23 DIAGNOSIS — D485 Neoplasm of uncertain behavior of skin: Secondary | ICD-10-CM | POA: Diagnosis not present

## 2022-05-23 DIAGNOSIS — J209 Acute bronchitis, unspecified: Secondary | ICD-10-CM | POA: Diagnosis not present

## 2022-06-19 ENCOUNTER — Other Ambulatory Visit: Payer: Self-pay | Admitting: Emergency Medicine

## 2022-06-22 DIAGNOSIS — R062 Wheezing: Secondary | ICD-10-CM | POA: Diagnosis not present

## 2022-06-22 DIAGNOSIS — J449 Chronic obstructive pulmonary disease, unspecified: Secondary | ICD-10-CM | POA: Diagnosis not present

## 2022-06-29 DIAGNOSIS — Z20818 Contact with and (suspected) exposure to other bacterial communicable diseases: Secondary | ICD-10-CM | POA: Diagnosis not present

## 2022-07-23 DIAGNOSIS — R062 Wheezing: Secondary | ICD-10-CM | POA: Diagnosis not present

## 2022-07-23 DIAGNOSIS — J449 Chronic obstructive pulmonary disease, unspecified: Secondary | ICD-10-CM | POA: Diagnosis not present

## 2022-07-30 ENCOUNTER — Telehealth: Payer: Self-pay | Admitting: Physician Assistant

## 2022-07-30 NOTE — Telephone Encounter (Signed)
Contacted patient to scheduled appointments. Patient is aware of appointments that are scheduled.   

## 2022-08-21 DIAGNOSIS — J449 Chronic obstructive pulmonary disease, unspecified: Secondary | ICD-10-CM | POA: Diagnosis not present

## 2022-08-21 DIAGNOSIS — R062 Wheezing: Secondary | ICD-10-CM | POA: Diagnosis not present

## 2022-08-22 DIAGNOSIS — J209 Acute bronchitis, unspecified: Secondary | ICD-10-CM | POA: Diagnosis not present

## 2022-08-22 DIAGNOSIS — D485 Neoplasm of uncertain behavior of skin: Secondary | ICD-10-CM | POA: Diagnosis not present

## 2022-08-24 ENCOUNTER — Other Ambulatory Visit: Payer: Medicare HMO

## 2022-08-24 ENCOUNTER — Ambulatory Visit: Payer: Medicare HMO | Admitting: Physician Assistant

## 2022-08-30 ENCOUNTER — Other Ambulatory Visit: Payer: Self-pay | Admitting: Physician Assistant

## 2022-08-30 DIAGNOSIS — D500 Iron deficiency anemia secondary to blood loss (chronic): Secondary | ICD-10-CM

## 2022-08-31 ENCOUNTER — Other Ambulatory Visit: Payer: Self-pay

## 2022-08-31 ENCOUNTER — Inpatient Hospital Stay: Payer: Medicare HMO | Attending: Physician Assistant

## 2022-08-31 ENCOUNTER — Inpatient Hospital Stay: Payer: Medicare HMO | Admitting: Physician Assistant

## 2022-08-31 VITALS — BP 105/57 | HR 100 | Temp 97.3°F | Resp 18 | Wt 136.7 lb

## 2022-08-31 DIAGNOSIS — Z9981 Dependence on supplemental oxygen: Secondary | ICD-10-CM | POA: Insufficient documentation

## 2022-08-31 DIAGNOSIS — D649 Anemia, unspecified: Secondary | ICD-10-CM | POA: Insufficient documentation

## 2022-08-31 DIAGNOSIS — D500 Iron deficiency anemia secondary to blood loss (chronic): Secondary | ICD-10-CM

## 2022-08-31 DIAGNOSIS — D509 Iron deficiency anemia, unspecified: Secondary | ICD-10-CM | POA: Diagnosis not present

## 2022-08-31 LAB — CBC WITH DIFFERENTIAL (CANCER CENTER ONLY)
Abs Immature Granulocytes: 0.01 10*3/uL (ref 0.00–0.07)
Basophils Absolute: 0.1 10*3/uL (ref 0.0–0.1)
Basophils Relative: 1 %
Eosinophils Absolute: 0.4 10*3/uL (ref 0.0–0.5)
Eosinophils Relative: 6 %
HCT: 42 % (ref 39.0–52.0)
Hemoglobin: 14 g/dL (ref 13.0–17.0)
Immature Granulocytes: 0 %
Lymphocytes Relative: 15 %
Lymphs Abs: 1.2 10*3/uL (ref 0.7–4.0)
MCH: 31.3 pg (ref 26.0–34.0)
MCHC: 33.3 g/dL (ref 30.0–36.0)
MCV: 94 fL (ref 80.0–100.0)
Monocytes Absolute: 0.6 10*3/uL (ref 0.1–1.0)
Monocytes Relative: 8 %
Neutro Abs: 5.5 10*3/uL (ref 1.7–7.7)
Neutrophils Relative %: 70 %
Platelet Count: 325 10*3/uL (ref 150–400)
RBC: 4.47 MIL/uL (ref 4.22–5.81)
RDW: 13.2 % (ref 11.5–15.5)
WBC Count: 7.7 10*3/uL (ref 4.0–10.5)
nRBC: 0 % (ref 0.0–0.2)

## 2022-08-31 LAB — CMP (CANCER CENTER ONLY)
ALT: 12 U/L (ref 0–44)
AST: 18 U/L (ref 15–41)
Albumin: 3.9 g/dL (ref 3.5–5.0)
Alkaline Phosphatase: 80 U/L (ref 38–126)
Anion gap: 7 (ref 5–15)
BUN: 13 mg/dL (ref 8–23)
CO2: 26 mmol/L (ref 22–32)
Calcium: 8.9 mg/dL (ref 8.9–10.3)
Chloride: 105 mmol/L (ref 98–111)
Creatinine: 1.47 mg/dL — ABNORMAL HIGH (ref 0.61–1.24)
GFR, Estimated: 47 mL/min — ABNORMAL LOW (ref >60)
Glucose, Bld: 100 mg/dL — ABNORMAL HIGH (ref 70–99)
Potassium: 4 mmol/L (ref 3.5–5.1)
Sodium: 138 mmol/L (ref 135–145)
Total Bilirubin: 0.4 mg/dL (ref 0.3–1.2)
Total Protein: 6.6 g/dL (ref 6.5–8.1)

## 2022-08-31 LAB — IRON AND IRON BINDING CAPACITY (CC-WL,HP ONLY)
Iron: 50 ug/dL (ref 45–182)
Saturation Ratios: 16 % — ABNORMAL LOW (ref 17.9–39.5)
TIBC: 316 ug/dL (ref 250–450)
UIBC: 266 ug/dL (ref 117–376)

## 2022-08-31 LAB — FERRITIN: Ferritin: 68 ng/mL (ref 24–336)

## 2022-08-31 NOTE — Progress Notes (Signed)
[place] Telephone:([phone]   [phone]  PROGRESS NOTE  Patient Care Team: Lorene Dy, MD as PCP - General (Internal Medicine)  Hematological/Oncological History #Normocytic Anemia #Iron Deficiency Anemia 1) Establish care with Dr. Lorenso Courier on 03/27/19 due to Hgb drop 14-12.2 per outside records 2) 03/27/2019: Hgb 11.5, WBC 6.6, Plt 301. Iron 70, TIBC 429, Sat 16% ferritin 16 3) 04/24/2019: Hgb 11.5, WBC 6.0, Plt 298. Iron 71, TIBC 369, Sat 19%, ferritin 15 4) 06/18/2019: EGD and colonoscopy performed. Revealed internal hemorrhoids and 3 mm polyp as well as dark stool throughout the colon.  5)  06/25/2019: Hgb 11.6, WBC 7.1, Plt 272 . Iron 57, TIBC 398, Sat 14%, Ferritin 22 6) 11/2019: received IV ferrous sucrose 200mg  x 5 doses 7) 01/14/2020: WBC 7.3, Hgb 11.8, Plt 231, Iron/Ferritin pending.  8) 01/30/2021: WBC 7.4, Hgb 12.3, MCV 95.4, Plt 289  Interval History:  Tyler Horne 82 y.o. male with medical history significant for iron deficiency anemia presents for a follow up visit. He was last seen on 02/23/2022. In the interim since his last visit he denies any changes to his health.   On exam today Mr. Lemas is overall stable with his energy and appetite. He does struggle to ambulate and generally is sedentary most of the day. He is on 2-3 L of supplemental oxygen at home. He denies any changes to his breathing. He denies nausea, vomiting or abdominal pain. His bowel habitsa re unchanged without recurrent episodes of diarrhea or constipation.  He otherwise denies any overt signs of bleeding, bruising, or dark stools.  A full 10 point ROS is listed below.  MEDICAL HISTORY:  Past Medical History:  Diagnosis Date   Anemia    Arthritis    DDD, shoulder   Asthma    hayfever, seasonal allergies, + smoker, uses primatene pill on occas.    COPD (chronic obstructive pulmonary disease) (HCC)    GERD (gastroesophageal reflux disease)    uses Rolaids on rare  occas.    Mass of finger    cystic mass right index finger   Postnasal drip    uses Benadryl & primatene as needed    Supplemental oxygen dependent    continuous   Wears dentures     SURGICAL HISTORY: Past Surgical History:  Procedure Laterality Date   BIOPSY  06/18/2019   Procedure: BIOPSY;  Surgeon: Irving Copas., MD;  Location: [facility] ENDOSCOPY;  Service: Gastroenterology;;   COLONOSCOPY WITH PROPOFOL N/A 06/18/2019   Procedure: COLONOSCOPY WITH PROPOFOL;  Surgeon: Irving Copas., MD;  Location: [place];  Service: Gastroenterology;  Laterality: N/A;   CYST EXCISION Right 12/18/2019   Procedure: EXCISION CYSTIC MASS WITH DISTAL INTERPHANLEGEAL JOINT ARTHROTOMY RIGHT INDEX FINGER;  Surgeon: Daryll Brod, MD;  Location: [place];  Service: Orthopedics;  Laterality: Right;  IV REGIONAL FOREARM BLOCK   ESOPHAGOGASTRODUODENOSCOPY (EGD) WITH PROPOFOL N/A 06/18/2019   Procedure: ESOPHAGOGASTRODUODENOSCOPY (EGD) WITH PROPOFOL;  Surgeon: Rush Landmark Telford Nab., MD;  Location: [place];  Service: Gastroenterology;  Laterality: N/A;   GANGLION CYST EXCISION Left    L hand    LUMBAR LAMINECTOMY/DECOMPRESSION MICRODISCECTOMY Left 10/02/2012   Procedure: LUMBAR LAMINECTOMY/DECOMPRESSION MICRODISCECTOMY 2 LEVELS;  Surgeon: Ophelia Charter, MD;  Location: [place] [facility];  Service: Neurosurgery;  Laterality: Left;  Left Lumbar four-five Lumbar five sacral one diskectomy   PILONIDAL CYST EXCISION     POLYPECTOMY  06/18/2019   Procedure: POLYPECTOMY;  Surgeon: Mansouraty, Telford Nab., MD;  Location: [facility] ENDOSCOPY;  Service: Gastroenterology;;   SHOULDER ARTHROSCOPY  2008   Left   TONSILLECTOMY      ALLERGIES:  has No Known Allergies.  MEDICATIONS:  Current Outpatient Medications  Medication Sig Dispense Refill   albuterol (PROVENTIL) (2.5 MG/3ML) 0.083% nebulizer solution Take 3 mLs (2.5 mg total) by nebulization every 6 (six) hours as needed for wheezing or shortness of breath. 75  mL 12   albuterol (VENTOLIN HFA) 108 (90 Base) MCG/ACT inhaler Inhale 1-2 puffs into the lungs every 4 (four) hours as needed for wheezing or shortness of breath. 18 g 3   ALPRAZolam (XANAX) 0.25 MG tablet Take 0.25 mg by mouth 2 (two) times daily as needed for anxiety.     Ascorbic Acid (VITAMIN C PO) Take 1 tablet by mouth daily.     Calcium-Magnesium-Zinc (CAL-MAG-ZINC PO) Take 1 tablet by mouth daily.     Cholecalciferol (VITAMIN D3 PO) Take 1 capsule by mouth daily.     Cyanocobalamin (B-12 PO) Place 1 tablet under the tongue daily.      dextromethorphan-guaiFENesin (MUCINEX DM) 30-600 MG 12hr tablet Take 1 tablet by mouth daily as needed for cough.     diphenhydrAMINE (BENADRYL) 25 mg capsule Take 25-50 mg by mouth every 8 (eight) hours as needed for allergies or sleep.      docusate sodium 100 MG CAPS Take 100 mg by mouth 2 (two) times daily. 60 capsule 1   Ensure Plus (ENSURE PLUS) LIQD Take 237 mLs by mouth daily.     ePHEDrine-guaiFENesin 12.5-200 MG TABS Take 0.25 tablets by mouth daily as needed (for allergies).     ferrous sulfate 325 (65 FE) MG EC tablet TAKE 1 TABLET BY MOUTH DAILY WITH BREAKFAST. TAKE WITH ORANGE JUICE OF VITAMIN C TO HELP ABSORPTION. 90 tablet 1   ipratropium-albuterol (DUONEB) 0.5-2.5 (3) MG/3ML SOLN TAKE 3 MLS BY NEBULIZATION IN THE MORNING, AT NOON, IN THE EVENING, AND AT BEDTIME. 360 mL 5   omeprazole (PRILOSEC) 20 MG capsule Take 1 capsule (20 mg total) by mouth as directed. 20 mg twice daily for 4 weeks then decrease to 20 mg daily. (Patient taking differently: Take 20 mg by mouth 2 (two) times daily before a meal.) 90 capsule 3   OXYGEN Inhale 2 L/min into the lungs continuous.     PRESCRIPTION MEDICATION Inject into the skin. Iron infusion/ Dr. Lorenso Courier     sennosides-docusate sodium (SENOKOT-S) 8.6-50 MG tablet Take 2 tablets by mouth daily as needed for constipation.      sodium chloride (OCEAN) 0.65 % nasal spray Place 1 spray into the nose daily as needed  for congestion.      tamsulosin (FLOMAX) 0.4 MG CAPS capsule Take 0.4 mg by mouth daily.     traMADol (ULTRAM) 50 MG tablet Take 50 mg by mouth every 6 (six) hours as needed for moderate pain.     triamcinolone cream (KENALOG) 0.1 % Apply 1 application topically 2 (two) times daily.     meclizine (ANTIVERT) 25 MG tablet Take 25 mg by mouth 3 (three) times daily as needed for dizziness. (Patient not taking: Reported on 08/31/2022)     No current facility-administered medications for this visit.    REVIEW OF SYSTEMS:   Constitutional: ( - ) fevers, ( - )  chills , ( - ) night sweats Eyes: ( - ) blurriness of vision, ( - ) double vision, ( - ) watery eyes Ears, nose, mouth, throat, and face: ( - ) mucositis, ( - )  sore throat Respiratory: ( + ) cough, ( + ) dyspnea, ( + ) wheezes Cardiovascular: ( - ) palpitation, ( - ) chest discomfort, ( - ) lower extremity swelling Gastrointestinal:  ( - ) nausea, ( - ) heartburn, ( - ) change in bowel habits Skin: ( - ) abnormal skin rashes Lymphatics: ( - ) new lymphadenopathy, ( - ) easy bruising Neurological: ( - ) numbness, ( - ) tingling, ( - ) new weaknesses Behavioral/Psych: ( - ) mood change, ( - ) new changes  All other systems were reviewed with the patient and are negative.  PHYSICAL EXAMINATION: ECOG PERFORMANCE STATUS: 3 - Symptomatic, >50% confined to bed  Vitals:   08/31/22 1440  BP: (!) 105/57  Pulse: 100  Resp: 18  Temp: (!) 97.3 F (36.3 C)  SpO2: 95%   Filed Weights   08/31/22 1440  Weight: 136 lb 11.2 oz (62 kg)    GENERAL: chronically ill appearing elderly Caucasian male in no distress and comfortable. In a wheelchair SKIN: skin color, texture, turgor are normal, no rashes or significant lesions EYES: conjunctiva are pink and non-injected, sclera clear LUNGS: clear to auscultation and percussion with normal breathing effort HEART: regular rate & rhythm and no murmurs and no lower extremity edema Musculoskeletal: no  cyanosis of digits and no clubbing  PSYCH: alert & oriented x 3, fluent speech NEURO: no focal motor/sensory deficits  LABORATORY DATA:  I have reviewed the data as listed Recent Results (from the past 2160 hour(s))  Iron and Iron Binding Capacity ([facility] only)     Status: Abnormal   Collection Time: 08/31/22  2:26 PM  Result Value Ref Range   Iron 50 45 - 182 ug/dL   TIBC 316 250 - 450 ug/dL   Saturation Ratios 16 (L) 17.9 - 39.5 %   UIBC 266 117 - 376 ug/dL    Comment: Performed at [facility], [address] [place], [place] [address]  CMP ([place] only)     Status: Abnormal   Collection Time: 08/31/22  2:26 PM  Result Value Ref Range   Sodium 138 135 - 145 mmol/L   Potassium 4.0 3.5 - 5.1 mmol/L   Chloride 105 98 - 111 mmol/L   CO2 26 22 - 32 mmol/L   Glucose, Bld 100 (H) 70 - 99 mg/dL    Comment: Glucose reference range applies only to samples taken after fasting for at least 8 hours.   BUN 13 8 - 23 mg/dL   Creatinine 1.47 (H) 0.61 - 1.24 mg/dL   Calcium 8.9 8.9 - 10.3 mg/dL   Total Protein 6.6 6.5 - 8.1 g/dL   Albumin 3.9 3.5 - 5.0 g/dL   AST 18 15 - 41 U/L   ALT 12 0 - 44 U/L   Alkaline Phosphatase 80 38 - 126 U/L   Total Bilirubin 0.4 0.3 - 1.2 mg/dL   GFR, Estimated 47 (L) >60 mL/min    Comment: (NOTE) Calculated using the CKD-EPI Creatinine Equation (2021)    Anion gap 7 5 - 15    Comment: Performed at [facility], [place] [address], [place], [place] [address]  CBC with Differential ([place] Only)     Status: None   Collection Time: 08/31/22  2:26 PM  Result Value Ref Range   WBC Count 7.7 4.0 - 10.5 K/uL   RBC 4.47 4.22 - 5.81 MIL/uL   Hemoglobin 14.0 13.0 - 17.0 g/dL  HCT 42.0 39.0 - 52.0 %   MCV 94.0 80.0 - 100.0 fL   MCH 31.3 26.0 - 34.0 pg   MCHC 33.3 30.0 - 36.0 g/dL   RDW 13.2 11.5 - 15.5 %   Platelet Count 325 150 - 400 K/uL   nRBC 0.0 0.0 - 0.2 %   Neutrophils Relative % 70 %    Neutro Abs 5.5 1.7 - 7.7 K/uL   Lymphocytes Relative 15 %   Lymphs Abs 1.2 0.7 - 4.0 K/uL   Monocytes Relative 8 %   Monocytes Absolute 0.6 0.1 - 1.0 K/uL   Eosinophils Relative 6 %   Eosinophils Absolute 0.4 0.0 - 0.5 K/uL   Basophils Relative 1 %   Basophils Absolute 0.1 0.0 - 0.1 K/uL   Immature Granulocytes 0 %   Abs Immature Granulocytes 0.01 0.00 - 0.07 K/uL    Comment: Performed at [facility], [place] [address], [place], [place] [address]    RADIOGRAPHIC STUDIES: No relevant radiographic studies.  ASSESSMENT & PLAN Unk Lybeck is a 82 y.o. male who presents for f/u of a normocytic anemia and iron deficiency.   Labs today are consistent with continued mild anemia.  On his initial visit he was recommended to start on iron 325 mg p.o. daily with a source of vitamin C. given the relative stability of his iron levels and hemoglobin I was concerned that the patient was not necessarily taking his iron as prescribed (his clinic visits are often tangential and difficult to focus, unclear if some component of cognitive decline).   Fortunately the patient has undergone GI evaluation with endoscopy and colonoscopy.  Colonoscopy was significant for internal hemorrhoids, but no overt source of bleeding was discovered.  Upper endoscopy revealed no clear sources of bleeding as well.  It was noted that if the patient continues to be iron deficient capsule endoscopy could be considered.   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  --work up for alternative etiologies of his anemia were negative, included B12, folate, and TSH. May have some component of low Hgb 2/2 to renal dysfunction.  --patient has undergone evaluation with EGD and colonoscopy in Jan 2021 which showed internal hemorrhoids and 3 mm polyp, but no other overt sources of bleeding.  --completed iron sucrose x 5  doses in June 2021 --Labs today show no evidence of anemia with Hgb 14.0. Iron panel shows serum iron 50, saturation 16%, ferritin pending.  --continue iron pills 325mg  PO daily with a source of vitamin C.  -- no clear indication for a bone marrow biopsy at this time.  --RTC in 6 months to continue monitoring   All questions were answered. The patient knows to call the clinic with any problems, questions or concerns.  I have spent a total of 25 minutes minutes of face-to-face and non-face-to-face time, preparing to see the patient,  performing a medically appropriate examination, counseling and educating the patient, documenting clinical information in the electronic health record, and care coordination.   Dede Query PA-C Dept of Hematology and [place] [facility] Phone: [phone]   08/31/2022 3:44 PM

## 2022-09-03 ENCOUNTER — Telehealth: Payer: Self-pay

## 2022-09-03 NOTE — Telephone Encounter (Signed)
-----   Message from Lincoln Brigham, PA-C sent at 09/03/2022  1:09 PM EDT ----- Please notify patient no evidence of anemia. Borderline iron deficiency so recommend to continue on iron pills. No need for IV iron at this time.    ----- Message ----- From: Buel Ream, Lab [facility] [place] Sent: 08/31/2022   2:38 PM EDT To: Lincoln Brigham, PA-C

## 2022-09-03 NOTE — Telephone Encounter (Signed)
LM for pt with lab results and recommendations. 

## 2022-09-21 DIAGNOSIS — R062 Wheezing: Secondary | ICD-10-CM | POA: Diagnosis not present

## 2022-09-21 DIAGNOSIS — J449 Chronic obstructive pulmonary disease, unspecified: Secondary | ICD-10-CM | POA: Diagnosis not present

## 2022-10-21 DIAGNOSIS — J449 Chronic obstructive pulmonary disease, unspecified: Secondary | ICD-10-CM | POA: Diagnosis not present

## 2022-10-21 DIAGNOSIS — R062 Wheezing: Secondary | ICD-10-CM | POA: Diagnosis not present

## 2022-11-21 DIAGNOSIS — J449 Chronic obstructive pulmonary disease, unspecified: Secondary | ICD-10-CM | POA: Diagnosis not present

## 2022-11-21 DIAGNOSIS — R062 Wheezing: Secondary | ICD-10-CM | POA: Diagnosis not present

## 2022-11-27 DIAGNOSIS — J209 Acute bronchitis, unspecified: Secondary | ICD-10-CM | POA: Diagnosis not present

## 2022-11-28 DIAGNOSIS — R5383 Other fatigue: Secondary | ICD-10-CM | POA: Diagnosis not present

## 2022-11-28 DIAGNOSIS — Z0000 Encounter for general adult medical examination without abnormal findings: Secondary | ICD-10-CM | POA: Diagnosis not present

## 2022-11-28 DIAGNOSIS — Z131 Encounter for screening for diabetes mellitus: Secondary | ICD-10-CM | POA: Diagnosis not present

## 2022-11-28 DIAGNOSIS — R002 Palpitations: Secondary | ICD-10-CM | POA: Diagnosis not present

## 2022-11-28 DIAGNOSIS — Z79899 Other long term (current) drug therapy: Secondary | ICD-10-CM | POA: Diagnosis not present

## 2022-11-28 DIAGNOSIS — Z1211 Encounter for screening for malignant neoplasm of colon: Secondary | ICD-10-CM | POA: Diagnosis not present

## 2022-11-28 DIAGNOSIS — M810 Age-related osteoporosis without current pathological fracture: Secondary | ICD-10-CM | POA: Diagnosis not present

## 2022-11-28 DIAGNOSIS — E039 Hypothyroidism, unspecified: Secondary | ICD-10-CM | POA: Diagnosis not present

## 2022-11-28 DIAGNOSIS — R1032 Left lower quadrant pain: Secondary | ICD-10-CM | POA: Diagnosis not present

## 2022-11-28 DIAGNOSIS — Z1331 Encounter for screening for depression: Secondary | ICD-10-CM | POA: Diagnosis not present

## 2022-11-28 DIAGNOSIS — H9113 Presbycusis, bilateral: Secondary | ICD-10-CM | POA: Diagnosis not present

## 2022-11-28 DIAGNOSIS — Z125 Encounter for screening for malignant neoplasm of prostate: Secondary | ICD-10-CM | POA: Diagnosis not present

## 2022-11-28 DIAGNOSIS — E7800 Pure hypercholesterolemia, unspecified: Secondary | ICD-10-CM | POA: Diagnosis not present

## 2022-11-28 DIAGNOSIS — Z1389 Encounter for screening for other disorder: Secondary | ICD-10-CM | POA: Diagnosis not present

## 2022-11-28 DIAGNOSIS — E785 Hyperlipidemia, unspecified: Secondary | ICD-10-CM | POA: Diagnosis not present

## 2022-11-28 DIAGNOSIS — I70219 Atherosclerosis of native arteries of extremities with intermittent claudication, unspecified extremity: Secondary | ICD-10-CM | POA: Diagnosis not present

## 2022-11-28 DIAGNOSIS — D485 Neoplasm of uncertain behavior of skin: Secondary | ICD-10-CM | POA: Diagnosis not present

## 2022-11-28 DIAGNOSIS — R42 Dizziness and giddiness: Secondary | ICD-10-CM | POA: Diagnosis not present

## 2022-11-28 DIAGNOSIS — Z7289 Other problems related to lifestyle: Secondary | ICD-10-CM | POA: Diagnosis not present

## 2022-12-03 ENCOUNTER — Other Ambulatory Visit: Payer: Self-pay | Admitting: Emergency Medicine

## 2022-12-21 DIAGNOSIS — J449 Chronic obstructive pulmonary disease, unspecified: Secondary | ICD-10-CM | POA: Diagnosis not present

## 2022-12-21 DIAGNOSIS — R062 Wheezing: Secondary | ICD-10-CM | POA: Diagnosis not present

## 2023-01-03 DIAGNOSIS — R1032 Left lower quadrant pain: Secondary | ICD-10-CM | POA: Diagnosis not present

## 2023-01-21 DIAGNOSIS — R062 Wheezing: Secondary | ICD-10-CM | POA: Diagnosis not present

## 2023-01-21 DIAGNOSIS — J449 Chronic obstructive pulmonary disease, unspecified: Secondary | ICD-10-CM | POA: Diagnosis not present

## 2023-02-21 ENCOUNTER — Other Ambulatory Visit: Payer: Self-pay | Admitting: Emergency Medicine

## 2023-02-21 DIAGNOSIS — J449 Chronic obstructive pulmonary disease, unspecified: Secondary | ICD-10-CM | POA: Diagnosis not present

## 2023-02-21 DIAGNOSIS — R062 Wheezing: Secondary | ICD-10-CM | POA: Diagnosis not present

## 2023-02-24 DIAGNOSIS — R42 Dizziness and giddiness: Secondary | ICD-10-CM | POA: Diagnosis not present

## 2023-02-25 ENCOUNTER — Inpatient Hospital Stay: Payer: Medicare HMO | Admitting: Hematology and Oncology

## 2023-02-25 ENCOUNTER — Inpatient Hospital Stay: Payer: Medicare HMO | Attending: Hematology and Oncology

## 2023-02-25 ENCOUNTER — Other Ambulatory Visit: Payer: Self-pay | Admitting: Hematology and Oncology

## 2023-02-25 VITALS — BP 110/70 | HR 96 | Temp 97.4°F | Resp 16 | Wt 134.2 lb

## 2023-02-25 DIAGNOSIS — E611 Iron deficiency: Secondary | ICD-10-CM | POA: Diagnosis not present

## 2023-02-25 DIAGNOSIS — D509 Iron deficiency anemia, unspecified: Secondary | ICD-10-CM

## 2023-02-25 DIAGNOSIS — D500 Iron deficiency anemia secondary to blood loss (chronic): Secondary | ICD-10-CM

## 2023-02-25 DIAGNOSIS — D485 Neoplasm of uncertain behavior of skin: Secondary | ICD-10-CM | POA: Diagnosis not present

## 2023-02-25 DIAGNOSIS — D649 Anemia, unspecified: Secondary | ICD-10-CM | POA: Insufficient documentation

## 2023-02-25 DIAGNOSIS — K648 Other hemorrhoids: Secondary | ICD-10-CM | POA: Diagnosis not present

## 2023-02-25 DIAGNOSIS — Z23 Encounter for immunization: Secondary | ICD-10-CM | POA: Diagnosis not present

## 2023-02-25 LAB — IRON AND IRON BINDING CAPACITY (CC-WL,HP ONLY)
Iron: 85 ug/dL (ref 45–182)
Saturation Ratios: 24 % (ref 17.9–39.5)
TIBC: 351 ug/dL (ref 250–450)
UIBC: 266 ug/dL (ref 117–376)

## 2023-02-25 LAB — CMP (CANCER CENTER ONLY)
ALT: 40 U/L (ref 0–44)
AST: 41 U/L (ref 15–41)
Albumin: 4.2 g/dL (ref 3.5–5.0)
Alkaline Phosphatase: 110 U/L (ref 38–126)
Anion gap: 7 (ref 5–15)
BUN: 15 mg/dL (ref 8–23)
CO2: 28 mmol/L (ref 22–32)
Calcium: 9.3 mg/dL (ref 8.9–10.3)
Chloride: 103 mmol/L (ref 98–111)
Creatinine: 1.5 mg/dL — ABNORMAL HIGH (ref 0.61–1.24)
GFR, Estimated: 46 mL/min — ABNORMAL LOW (ref >60)
Glucose, Bld: 103 mg/dL — ABNORMAL HIGH (ref 70–99)
Potassium: 4.2 mmol/L (ref 3.5–5.1)
Sodium: 138 mmol/L (ref 135–145)
Total Bilirubin: 0.5 mg/dL (ref 0.3–1.2)
Total Protein: 6.9 g/dL (ref 6.5–8.1)

## 2023-02-25 LAB — CBC WITH DIFFERENTIAL (CANCER CENTER ONLY)
Abs Immature Granulocytes: 0.05 10*3/uL (ref 0.00–0.07)
Basophils Absolute: 0.1 10*3/uL (ref 0.0–0.1)
Basophils Relative: 1 %
Eosinophils Absolute: 0.4 10*3/uL (ref 0.0–0.5)
Eosinophils Relative: 3 %
HCT: 44.4 % (ref 39.0–52.0)
Hemoglobin: 14.7 g/dL (ref 13.0–17.0)
Immature Granulocytes: 0 %
Lymphocytes Relative: 9 %
Lymphs Abs: 1.1 10*3/uL (ref 0.7–4.0)
MCH: 31.2 pg (ref 26.0–34.0)
MCHC: 33.1 g/dL (ref 30.0–36.0)
MCV: 94.3 fL (ref 80.0–100.0)
Monocytes Absolute: 0.7 10*3/uL (ref 0.1–1.0)
Monocytes Relative: 6 %
Neutro Abs: 10.4 10*3/uL — ABNORMAL HIGH (ref 1.7–7.7)
Neutrophils Relative %: 81 %
Platelet Count: 244 10*3/uL (ref 150–400)
RBC: 4.71 MIL/uL (ref 4.22–5.81)
RDW: 13.2 % (ref 11.5–15.5)
WBC Count: 12.8 10*3/uL — ABNORMAL HIGH (ref 4.0–10.5)
nRBC: 0 % (ref 0.0–0.2)

## 2023-02-25 LAB — RETIC PANEL
Immature Retic Fract: 7.1 % (ref 2.3–15.9)
RBC.: 4.6 MIL/uL (ref 4.22–5.81)
Retic Count, Absolute: 65.3 10*3/uL (ref 19.0–186.0)
Retic Ct Pct: 1.4 % (ref 0.4–3.1)
Reticulocyte Hemoglobin: 36.1 pg (ref >27.9)

## 2023-02-25 NOTE — Progress Notes (Signed)
[facility] Telephone:([phone]   [phone]) [phone]  PROGRESS NOTE  Patient Care Team: Burton Apley, MD as PCP - General (Internal Medicine)  Hematological/Oncological History #Normocytic Anemia #Iron Deficiency Anemia 1) Establish care with Dr. Leonides Schanz on 03/27/19 due to Hgb drop 14-12.2 per outside records 2) 03/27/2019: Hgb 11.5, WBC 6.6, Plt 301. Iron 70, TIBC 429, Sat 16% ferritin 16 3) 04/24/2019: Hgb 11.5, WBC 6.0, Plt 298. Iron 71, TIBC 369, Sat 19%, ferritin 15 4) 06/18/2019: EGD and colonoscopy performed. Revealed internal hemorrhoids and 3 mm polyp as well as dark stool throughout the colon.  5)  06/25/2019: Hgb 11.6, WBC 7.1, Plt 272 . Iron 57, TIBC 398, Sat 14%, Ferritin 22 6) 11/2019: received IV ferrous sucrose 200mg  x 5 doses 7) 01/14/2020: WBC 7.3, Hgb 11.8, Plt 231, Iron/Ferritin pending.  8) 01/30/2021: WBC 7.4, Hgb 12.3, MCV 95.4, Plt 289  Interval History:  Tyler Horne 82 y.o. male with medical history significant for iron deficiency anemia presents for a follow up visit. He was last seen on 08/31/2022. In the interim since his last visit he denies any changes to his health.   On exam today [name] is accompanied by his brother.  He reports that his energy levels are good but he is not doing much walking.  His brother worries that he only walks from the bed to the bathroom which is about 10 steps.  He reports that he simply does not want to perform more physical activity.  He does have some shortness of breath when he is not on his oxygen therapy.  He is currently on his oxygen at this time without his oxygen on arrival he only desatted down to 96%.  He reports that he is otherwise at his baseline level of health with no recent fevers, chills, sweats, nausea vomiting or diarrhea.  He continues to take his p.o. iron therapy as prescribed.  It is not causing him any stomach upset but some occasional constipation for which he takes senna docusate.  Overall  he is willing and able to continue on p.o. iron therapy at this time.  A full 10 point ROS is listed below.  MEDICAL HISTORY:  Past Medical History:  Diagnosis Date   Anemia    Arthritis    DDD, shoulder   Asthma    hayfever, seasonal allergies, + smoker, uses primatene pill on occas.    COPD (chronic obstructive pulmonary disease) (HCC)    GERD (gastroesophageal reflux disease)    uses Rolaids on rare occas.    Mass of finger    cystic mass right index finger   Postnasal drip    uses Benadryl & primatene as needed    Supplemental oxygen dependent    continuous   Wears dentures     SURGICAL HISTORY: Past Surgical History:  Procedure Laterality Date   BIOPSY  06/18/2019   Procedure: BIOPSY;  Surgeon: Lemar Lofty., MD;  Location: [facility] ENDOSCOPY;  Service: Gastroenterology;;   COLONOSCOPY WITH PROPOFOL N/A 06/18/2019   Procedure: COLONOSCOPY WITH PROPOFOL;  Surgeon: Lemar Lofty., MD;  Location: [facility] ENDOSCOPY;  Service: Gastroenterology;  Laterality: N/A;   CYST EXCISION Right 12/18/2019   Procedure: EXCISION CYSTIC MASS WITH DISTAL INTERPHANLEGEAL JOINT ARTHROTOMY RIGHT INDEX FINGER;  Surgeon: Cindee Salt, MD;  Location: [facility] OR;  Service: Orthopedics;  Laterality: Right;  IV REGIONAL FOREARM BLOCK   ESOPHAGOGASTRODUODENOSCOPY (EGD) WITH PROPOFOL N/A 06/18/2019   Procedure: ESOPHAGOGASTRODUODENOSCOPY (EGD) WITH PROPOFOL;  Surgeon: Lemar Lofty.,  MD;  Location: [facility] ENDOSCOPY;  Service: Gastroenterology;  Laterality: N/A;   GANGLION CYST EXCISION Left    L hand    LUMBAR LAMINECTOMY/DECOMPRESSION MICRODISCECTOMY Left 10/02/2012   Procedure: LUMBAR LAMINECTOMY/DECOMPRESSION MICRODISCECTOMY 2 LEVELS;  Surgeon: Cristi Loron, MD;  Location: [facility] NEURO ORS;  Service: Neurosurgery;  Laterality: Left;  Left Lumbar four-five Lumbar five sacral one diskectomy   PILONIDAL CYST EXCISION     POLYPECTOMY  06/18/2019   Procedure: POLYPECTOMY;  Surgeon: Mansouraty, Netty Starring., MD;  Location: [facility] ENDOSCOPY;  Service: Gastroenterology;;   SHOULDER ARTHROSCOPY  2008   Left   TONSILLECTOMY      ALLERGIES:  has No Known Allergies.  MEDICATIONS:  Current Outpatient Medications  Medication Sig Dispense Refill   albuterol (PROVENTIL) (2.5 MG/3ML) 0.083% nebulizer solution Take 3 mLs (2.5 mg total) by nebulization every 6 (six) hours as needed for wheezing or shortness of breath. 75 mL 12   albuterol (VENTOLIN HFA) 108 (90 Base) MCG/ACT inhaler INHALE 2 PUFFS INTO THE LUNGS 4 TIMES A DAY 18 each 11   ALPRAZolam (XANAX) 0.25 MG tablet Take 0.25 mg by mouth 2 (two) times daily as needed for anxiety.     Ascorbic Acid (VITAMIN C PO) Take 1 tablet by mouth daily.     Calcium-Magnesium-Zinc (CAL-MAG-ZINC PO) Take 1 tablet by mouth daily.     Cholecalciferol (VITAMIN D3 PO) Take 1 capsule by mouth daily.     Cyanocobalamin (B-12 PO) Place 1 tablet under the tongue daily.      dextromethorphan-guaiFENesin (MUCINEX DM) 30-600 MG 12hr tablet Take 1 tablet by mouth daily as needed for cough.     diphenhydrAMINE (BENADRYL) 25 mg capsule Take 25-50 mg by mouth every 8 (eight) hours as needed for allergies or sleep.      docusate sodium 100 MG CAPS Take 100 mg by mouth 2 (two) times daily. 60 capsule 1   Ensure Plus (ENSURE PLUS) LIQD Take 237 mLs by mouth daily.     ePHEDrine-guaiFENesin 12.5-200 MG TABS Take 0.25 tablets by mouth daily as needed (for allergies).     ferrous sulfate 325 (65 FE) MG EC tablet TAKE 1 TABLET BY MOUTH DAILY WITH BREAKFAST. TAKE WITH ORANGE JUICE OF VITAMIN C TO HELP ABSORPTION. 90 tablet 1   ipratropium-albuterol (DUONEB) 0.5-2.5 (3) MG/3ML SOLN TAKE 3 MLS BY NEBULIZATION IN THE MORNING, AT NOON, IN THE EVENING, AND AT BEDTIME. 360 mL 5   omeprazole (PRILOSEC) 20 MG capsule Take 1 capsule (20 mg total) by mouth as directed. 20 mg twice daily for 4 weeks then decrease to 20 mg daily. (Patient taking differently: Take 20 mg by mouth 2 (two) times daily  before a meal.) 90 capsule 3   OXYGEN Inhale 2 L/min into the lungs continuous.     PRESCRIPTION MEDICATION Inject into the skin. Iron infusion/ Dr. Leonides Schanz     sennosides-docusate sodium (SENOKOT-S) 8.6-50 MG tablet Take 2 tablets by mouth daily as needed for constipation.      sodium chloride (OCEAN) 0.65 % nasal spray Place 1 spray into the nose daily as needed for congestion.      tamsulosin (FLOMAX) 0.4 MG CAPS capsule Take 0.4 mg by mouth daily.     traMADol (ULTRAM) 50 MG tablet Take 50 mg by mouth every 6 (six) hours as needed for moderate pain.     triamcinolone cream (KENALOG) 0.1 % Apply 1 application topically 2 (two) times daily.     No current facility-administered medications for  this visit.    REVIEW OF SYSTEMS:   Constitutional: ( - ) fevers, ( - )  chills , ( - ) night sweats Eyes: ( - ) blurriness of vision, ( - ) double vision, ( - ) watery eyes Ears, nose, mouth, throat, and face: ( - ) mucositis, ( - ) sore throat Respiratory: ( + ) cough, ( + ) dyspnea, ( + ) wheezes Cardiovascular: ( - ) palpitation, ( - ) chest discomfort, ( - ) lower extremity swelling Gastrointestinal:  ( - ) nausea, ( - ) heartburn, ( - ) change in bowel habits Skin: ( - ) abnormal skin rashes Lymphatics: ( - ) new lymphadenopathy, ( - ) easy bruising Neurological: ( - ) numbness, ( - ) tingling, ( - ) new weaknesses Behavioral/Psych: ( - ) mood change, ( - ) new changes  All other systems were reviewed with the patient and are negative.  PHYSICAL EXAMINATION: ECOG PERFORMANCE STATUS: 3 - Symptomatic, >50% confined to bed  Vitals:   02/25/23 1533  BP: 110/70  Pulse: 96  Resp: 16  Temp: (!) 97.4 F (36.3 C)  SpO2: 96%    Filed Weights   02/25/23 1533  Weight: 134 lb 3.2 oz (60.9 kg)     GENERAL: chronically ill appearing elderly Caucasian male in no distress and comfortable. In a wheelchair SKIN: skin color, texture, turgor are normal, no rashes or significant lesions EYES:  conjunctiva are pink and non-injected, sclera clear LUNGS: clear to auscultation and percussion with normal breathing effort HEART: regular rate & rhythm and no murmurs and no lower extremity edema Musculoskeletal: no cyanosis of digits and no clubbing  PSYCH: alert & oriented x 3, fluent speech NEURO: no focal motor/sensory deficits  LABORATORY DATA:  I have reviewed the data as listed Recent Results (from the past 2160 hour(s))  Retic Panel     Status: None   Collection Time: 02/25/23  2:56 PM  Result Value Ref Range   Retic Ct Pct 1.4 0.4 - 3.1 %   RBC. 4.60 4.22 - 5.81 MIL/uL   Retic Count, Absolute 65.3 19.0 - 186.0 K/uL   Immature Retic Fract 7.1 2.3 - 15.9 %   Reticulocyte Hemoglobin 36.1 >27.9 pg    Comment:        Given the high negative predictive value of a RET-He result > 32 pg iron deficiency is essentially excluded. If this patient is anemic other etiologies should be considered. Performed at [facility], [address], [place], [place] [address]   CBC with Differential (Cancer Center Only)     Status: Abnormal   Collection Time: 02/25/23  2:56 PM  Result Value Ref Range   WBC Count 12.8 (H) 4.0 - 10.5 K/uL   RBC 4.71 4.22 - 5.81 MIL/uL   Hemoglobin 14.7 13.0 - 17.0 g/dL   HCT 59.5 63.8 - 75.6 %   MCV 94.3 80.0 - 100.0 fL   MCH 31.2 26.0 - 34.0 pg   MCHC 33.1 30.0 - 36.0 g/dL   RDW 43.3 29.5 - 18.8 %   Platelet Count 244 150 - 400 K/uL   nRBC 0.0 0.0 - 0.2 %   Neutrophils Relative % 81 %   Neutro Abs 10.4 (H) 1.7 - 7.7 K/uL   Lymphocytes Relative 9 %   Lymphs Abs 1.1 0.7 - 4.0 K/uL   Monocytes Relative 6 %   Monocytes Absolute 0.7 0.1 - 1.0 K/uL   Eosinophils Relative 3 %  Eosinophils Absolute 0.4 0.0 - 0.5 K/uL   Basophils Relative 1 %   Basophils Absolute 0.1 0.0 - 0.1 K/uL   Immature Granulocytes 0 %   Abs Immature Granulocytes 0.05 0.00 - 0.07 K/uL    Comment: Performed at [facility], [address] [place], [place] [address]     RADIOGRAPHIC STUDIES: No relevant radiographic studies.  ASSESSMENT & PLAN Mathayus Heinig is a 82 y.o. male who presents for f/u of a normocytic anemia and iron deficiency.   Labs today are consistent with continued mild anemia.  On his initial visit he was recommended to start on iron 325 mg p.o. daily with a source of vitamin C. given the relative stability of his iron levels and hemoglobin I was concerned that the patient was not necessarily taking his iron as prescribed (his clinic visits are often tangential and difficult to focus, unclear if some component of cognitive decline).   Fortunately the patient has undergone GI evaluation with endoscopy and colonoscopy.  Colonoscopy was significant for internal hemorrhoids, but no overt source of bleeding was discovered.  Upper endoscopy revealed no clear sources of bleeding as well.  It was noted that if the patient continues to be iron deficient capsule endoscopy could be considered.   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  --work up for alternative etiologies of his anemia were negative, included B12, folate, and TSH. May have some component of low Hgb 2/2 to renal dysfunction.  --patient has undergone evaluation with EGD and colonoscopy in Jan 2021 which showed internal hemorrhoids and 3 mm polyp, but no other overt sources of bleeding.  --completed iron sucrose x 5 doses in June 2021 --Labs today show white blood cell 12.8, hemoglobin 14.7, MCV 94.3, and platelets of 244 --continue iron pills 325mg  PO daily with a source of vitamin C.  -- no clear indication for a bone marrow biopsy at this time.  --RTC in 12 months to continue monitoring   All questions were answered. The patient knows to call the clinic with any problems, questions or concerns.  I have spent a total of 25 minutes minutes of  face-to-face and non-face-to-face time, preparing to see the patient,  performing a medically appropriate examination, counseling and educating the patient, documenting clinical information in the electronic health record, and care coordination.   Ulysees Barns, MD Department of Hematology/Oncology [facility] at [facility] Phone: [phone] Pager: [phone] Email: [email or web address]   02/25/2023 3:36 PM

## 2023-02-26 LAB — FERRITIN: Ferritin: 117 ng/mL (ref 24–336)

## 2023-03-22 ENCOUNTER — Other Ambulatory Visit: Payer: Self-pay | Admitting: Emergency Medicine

## 2023-03-22 ENCOUNTER — Other Ambulatory Visit: Payer: Self-pay | Admitting: Pulmonary Disease

## 2023-03-23 DIAGNOSIS — J449 Chronic obstructive pulmonary disease, unspecified: Secondary | ICD-10-CM | POA: Diagnosis not present

## 2023-03-23 DIAGNOSIS — R062 Wheezing: Secondary | ICD-10-CM | POA: Diagnosis not present

## 2023-03-28 ENCOUNTER — Encounter: Payer: Self-pay | Admitting: Emergency Medicine

## 2023-03-28 ENCOUNTER — Ambulatory Visit: Payer: Medicare HMO | Admitting: Emergency Medicine

## 2023-03-28 VITALS — BP 90/64 | HR 105 | Temp 97.9°F | Ht 70.0 in | Wt 137.0 lb

## 2023-03-28 DIAGNOSIS — Z23 Encounter for immunization: Secondary | ICD-10-CM | POA: Diagnosis not present

## 2023-03-28 DIAGNOSIS — J9611 Chronic respiratory failure with hypoxia: Secondary | ICD-10-CM

## 2023-03-28 DIAGNOSIS — J449 Chronic obstructive pulmonary disease, unspecified: Secondary | ICD-10-CM | POA: Diagnosis not present

## 2023-03-28 DIAGNOSIS — F17200 Nicotine dependence, unspecified, uncomplicated: Secondary | ICD-10-CM

## 2023-03-28 NOTE — Assessment & Plan Note (Signed)
Patient states that he is quit

## 2023-03-28 NOTE — Addendum Note (Signed)
Addended by: Gay Filler T on: 03/28/2023 04:35 PM   Modules accepted: Orders

## 2023-03-28 NOTE — Progress Notes (Signed)
Subjective:    Patient ID: Tyler Horne, male    DOB: 02-24-1941, 82 y.o.   MRN: [id number]  HPI  ROV 03/27/22 --follow-up visit for 82 year old man with history of tobacco use and very severe COPD.  He still smokes a few cigarettes a week.  He has been managed on DuoNeb in the past.  Have discussed with him scheduled regimen but typically uses as needed. He is very concerned that he is not getting enough Duo/Neb. There have been shortages.  He has had his flu shot. Not planning to get covid shot this year.   ROV 03/28/2023 --Tyler Horne is 65 with a history of tobacco use and very severe COPD.  He has a history of chronic hypoxemic respiratory failure on oxygen at 2 L/min pulsed He has been managed on scheduled DuoNeb 4 times a day.  He quit smoking in 09/2020.  He reports that his breathing has been difficult, he has waked at night w SOB and cough. Able to walk through the house but then very SOB. Uses mucinex as needed. He has not had any pred or abx since last time.    Review of Systems  Constitutional: Negative.  Negative for fever and unexpected weight change.  HENT:  Positive for postnasal drip and rhinorrhea. Negative for congestion, dental problem, ear pain, nosebleeds, sinus pressure, sneezing, sore throat and trouble swallowing.   Eyes: Negative.  Negative for redness and itching.  Respiratory:  Positive for cough and shortness of breath. Negative for chest tightness and wheezing.   Cardiovascular: Negative.  Negative for palpitations and leg swelling.  Gastrointestinal: Negative.  Negative for nausea and vomiting.  Genitourinary: Negative.  Negative for dysuria.  Musculoskeletal: Negative.  Negative for joint swelling.  Skin: Negative.  Negative for rash.  Neurological: Negative.  Negative for headaches.  Hematological: Negative.  Does not bruise/bleed easily.  Psychiatric/Behavioral: Negative.  Negative for dysphoric mood. The patient is not nervous/anxious.        Objective:    Physical Exam Vitals:   03/28/23 1533  BP: 90/64  Pulse: (!) 105  Temp: 97.9 F (36.6 C)  TempSrc: Oral  SpO2: 91%  Weight: 137 lb (62.1 kg)  Height: 5\' 10"  (1.778 m)    Gen: Pleasant, thin with some temporal wasting, in no distress,  normal affect  ENT: No lesions,  mouth clear,  oropharynx clear, no postnasal drip  Neck: No JVD, no stridor  Lungs: No use of accessory muscles, distant and decreased throughout, few exp wheezes.   Cardiovascular: RRR, heart sounds normal, no murmur or gallops, no peripheral edema  Musculoskeletal: No deformities, no cyanosis or clubbing  Neuro: alert, non focal  Skin: Warm, no lesions or rashes     Assessment & Plan:  Tobacco use disorder Patient states that he is quit  COPD (chronic obstructive pulmonary disease) (HCC) No flares reported.  I did talk to him briefly about possible chronic prednisone given his level of debility.  Unclear based on our conversation whether he would be willing to try this.  He is uncertain.  For now I will continue his same regimen.  Plan for flu shot today.  Please continue your DuoNeb 4 times a day on a schedule. Keep your albuterol available to use 2 puffs when needed for shortness of breath, chest tightness, wheezing. Use your Mucinex as needed Flu shot today Congratulations on stopping smoking Follow with Dr. Delton Coombes in 1 year, sooner if you have any problems.  Chronic respiratory failure with hypoxia (  HCC) Continue your oxygen at 1.5-2.0 L/min    Levy Pupa, MD, PhD 03/28/2023, 4:07 PM [facility] [phone] or if no answer [phone]

## 2023-03-28 NOTE — Assessment & Plan Note (Signed)
Continue your oxygen at 1.5-2.0 L/min

## 2023-03-28 NOTE — Assessment & Plan Note (Signed)
No flares reported.  I did talk to him briefly about possible chronic prednisone given his level of debility.  Unclear based on our conversation whether he would be willing to try this.  He is uncertain.  For now I will continue his same regimen.  Plan for flu shot today.  Please continue your DuoNeb 4 times a day on a schedule. Keep your albuterol available to use 2 puffs when needed for shortness of breath, chest tightness, wheezing. Use your Mucinex as needed Flu shot today Congratulations on stopping smoking Follow with Dr. Delton Coombes in 1 year, sooner if you have any problems.

## 2023-03-28 NOTE — Patient Instructions (Addendum)
Please continue your DuoNeb 4 times a day on a schedule. Keep your albuterol available to use 2 puffs when needed for shortness of breath, chest tightness, wheezing. Use your Mucinex as needed Continue your oxygen at 1.5-2.0 L/min Flu shot today Congratulations on stopping smoking Follow with Dr. Delton Coombes in 1 year, sooner if you have any problems.

## 2023-03-30 DIAGNOSIS — J209 Acute bronchitis, unspecified: Secondary | ICD-10-CM | POA: Diagnosis not present

## 2023-04-23 DIAGNOSIS — R062 Wheezing: Secondary | ICD-10-CM | POA: Diagnosis not present

## 2023-04-23 DIAGNOSIS — J449 Chronic obstructive pulmonary disease, unspecified: Secondary | ICD-10-CM | POA: Diagnosis not present

## 2023-05-20 DIAGNOSIS — J209 Acute bronchitis, unspecified: Secondary | ICD-10-CM | POA: Diagnosis not present

## 2023-05-21 DIAGNOSIS — D485 Neoplasm of uncertain behavior of skin: Secondary | ICD-10-CM | POA: Diagnosis not present

## 2023-05-23 DIAGNOSIS — R062 Wheezing: Secondary | ICD-10-CM | POA: Diagnosis not present

## 2023-05-23 DIAGNOSIS — J449 Chronic obstructive pulmonary disease, unspecified: Secondary | ICD-10-CM | POA: Diagnosis not present

## 2023-06-25 ENCOUNTER — Other Ambulatory Visit: Payer: Self-pay | Admitting: Pulmonary Disease

## 2023-12-21 ENCOUNTER — Other Ambulatory Visit: Payer: Self-pay | Admitting: Emergency Medicine

## 2024-01-17 ENCOUNTER — Other Ambulatory Visit: Payer: Self-pay | Admitting: Emergency Medicine

## 2024-02-01 ENCOUNTER — Other Ambulatory Visit: Payer: Self-pay | Admitting: Emergency Medicine

## 2024-02-01 DIAGNOSIS — J9611 Chronic respiratory failure with hypoxia: Secondary | ICD-10-CM

## 2024-02-01 DIAGNOSIS — J449 Chronic obstructive pulmonary disease, unspecified: Secondary | ICD-10-CM

## 2024-02-19 ENCOUNTER — Telehealth: Payer: Self-pay | Admitting: Hematology and Oncology

## 2024-02-20 ENCOUNTER — Inpatient Hospital Stay

## 2024-02-20 ENCOUNTER — Other Ambulatory Visit: Payer: Self-pay | Admitting: Hematology and Oncology

## 2024-02-20 ENCOUNTER — Inpatient Hospital Stay: Admitting: Hematology and Oncology

## 2024-02-20 DIAGNOSIS — D500 Iron deficiency anemia secondary to blood loss (chronic): Secondary | ICD-10-CM

## 2024-02-27 ENCOUNTER — Other Ambulatory Visit: Payer: Medicare HMO

## 2024-02-27 ENCOUNTER — Encounter: Payer: Self-pay | Admitting: Emergency Medicine

## 2024-02-27 ENCOUNTER — Ambulatory Visit: Payer: Medicare HMO | Admitting: Hematology and Oncology

## 2024-02-27 ENCOUNTER — Ambulatory Visit (INDEPENDENT_AMBULATORY_CARE_PROVIDER_SITE_OTHER): Admitting: Emergency Medicine

## 2024-02-27 VITALS — BP 124/73 | HR 101 | Temp 98.7°F | Ht 70.0 in

## 2024-02-27 DIAGNOSIS — F17210 Nicotine dependence, cigarettes, uncomplicated: Secondary | ICD-10-CM | POA: Diagnosis not present

## 2024-02-27 DIAGNOSIS — J9611 Chronic respiratory failure with hypoxia: Secondary | ICD-10-CM

## 2024-02-27 DIAGNOSIS — J449 Chronic obstructive pulmonary disease, unspecified: Secondary | ICD-10-CM

## 2024-02-27 DIAGNOSIS — Z23 Encounter for immunization: Secondary | ICD-10-CM | POA: Diagnosis not present

## 2024-02-27 DIAGNOSIS — F17200 Nicotine dependence, unspecified, uncomplicated: Secondary | ICD-10-CM

## 2024-02-27 NOTE — Assessment & Plan Note (Signed)
 He states that he has quit but unclear whether this is completely accurate.  He may be still intermittently smoking a few cigarettes a week.

## 2024-02-27 NOTE — Progress Notes (Signed)
 [facility] OFFICE PROGRESS NOTE  Henry Ingle, MD [address] [place] [address]  DIAGNOSIS:  #Normocytic Anemia #Iron  Deficiency Anemia 1) Establish care with Dr. Federico on 03/27/19 due to Hgb drop 14-12.2 per outside records 2) 03/27/2019: Hgb 11.5, WBC 6.6, Plt 301. Iron  70, TIBC 429, Sat 16% ferritin 16 3) 04/24/2019: Hgb 11.5, WBC 6.0, Plt 298. Iron  71, TIBC 369, Sat 19%, ferritin 15 4) 06/18/2019: EGD and colonoscopy performed. Revealed internal hemorrhoids and 3 mm polyp as well as dark stool throughout the colon.  5)  06/25/2019: Hgb 11.6, WBC 7.1, Plt 272 . Iron  57, TIBC 398, Sat 14%, Ferritin 22 6) 11/2019: received IV ferrous sucrose 200mg  x 5 doses 7) 01/14/2020: WBC 7.3, Hgb 11.8, Plt 231, Iron /Ferritin pending.  8) 01/30/2021: WBC 7.4, Hgb 12.3, MCV 95.4, Plt 289   CURRENT THERAPY: Ferrous sulfate  p.o. daily  INTERVAL HISTORY: Tyler Horne 83 y.o. male returns today for a follow-up visit accompanied by his brother. The patient is followed for his history of iron  deficiency anemia by Dr. Federico.  He was last seen in the clinic in September 2024.  He denies any major changes in his health since he was last seen. He reports that his energy is poor energy which is typical for him. He is not very active.  He does have some shortness of breath with exertion for which he sees Dr. Shelah from pulmonary medicine. He is on oxygen  therapy.  He denies any abnormal bleeding or bruising.  He denies any lightheadedness or syncope.He continues to take his p.o. iron  supplement daily as prescribed without any GI upset except constipation for which he takes Doculax. He denies any visible bleeding. He is here today for evaluation and repeat blood work.   MEDICAL HISTORY: Past Medical History:  Diagnosis Date   Anemia    Arthritis    DDD, shoulder   Asthma    hayfever, seasonal allergies, + smoker, uses primatene  pill on occas.    COPD (chronic obstructive pulmonary  disease) (HCC)    GERD (gastroesophageal reflux disease)    uses Rolaids on rare occas.    Mass of finger    cystic mass right index finger   Postnasal drip    uses Benadryl  & primatene  as needed    Supplemental oxygen  dependent    continuous   Wears dentures     ALLERGIES:  has no known allergies.  MEDICATIONS:  Current Outpatient Medications  Medication Sig Dispense Refill   albuterol  (PROVENTIL ) (2.5 MG/3ML) 0.083% nebulizer solution INHALE 3 ML BY NEBULIZATION EVERY 6 HOURS AS NEEDED FOR WHEEZING OR SHORTNESS OF BREATH 75 mL 12   ALPRAZolam (XANAX) 0.25 MG tablet Take 0.25 mg by mouth 2 (two) times daily as needed for anxiety.     Ascorbic Acid (VITAMIN C PO) Take 1 tablet by mouth daily.     Calcium-Magnesium-Zinc  (CAL-MAG-ZINC  PO) Take 1 tablet by mouth daily.     Cholecalciferol (VITAMIN D3 PO) Take 1 capsule by mouth daily.     Cyanocobalamin  (B-12 PO) Place 1 tablet under the tongue daily.      dextromethorphan-guaiFENesin (MUCINEX DM) 30-600 MG 12hr tablet Take 1 tablet by mouth daily as needed for cough.     diphenhydrAMINE  (BENADRYL ) 25 mg capsule Take 25-50 mg by mouth every 8 (eight) hours as needed for allergies or sleep.      docusate sodium  100 MG CAPS Take 100 mg by mouth 2 (two) times daily. 60 capsule 1  Ensure Plus (ENSURE PLUS) LIQD Take 237 mLs by mouth daily.     ePHEDrine -guaiFENesin 12.5-200 MG TABS Take 0.25 tablets by mouth daily as needed (for allergies).     ferrous sulfate  325 (65 FE) MG EC tablet TAKE 1 TABLET BY MOUTH DAILY WITH BREAKFAST. TAKE WITH ORANGE JUICE OF VITAMIN C TO HELP ABSORPTION. 90 tablet 1   ipratropium-albuterol  (DUONEB) 0.5-2.5 (3) MG/3ML SOLN TAKE 3 MLS BY NEBULIZATION IN THE MORNING, AT NOON, IN THE EVENING, AND AT BEDTIME. 360 mL 2   omeprazole  (PRILOSEC) 20 MG capsule Take 1 capsule (20 mg total) by mouth as directed. 20 mg twice daily for 4 weeks then decrease to 20 mg daily. 90 capsule 3   OXYGEN  Inhale 2 L/min into the lungs  continuous.     PRESCRIPTION MEDICATION Inject into the skin. Iron  infusion/ Dr. Federico     sennosides-docusate sodium  (SENOKOT-S) 8.6-50 MG tablet Take 2 tablets by mouth daily as needed for constipation.      sodium chloride  (OCEAN) 0.65 % nasal spray Place 1 spray into the nose daily as needed for congestion.      tamsulosin (FLOMAX) 0.4 MG CAPS capsule Take 0.4 mg by mouth daily.     traMADol (ULTRAM) 50 MG tablet Take 50 mg by mouth every 6 (six) hours as needed for moderate pain.     triamcinolone cream (KENALOG) 0.1 % Apply 1 application topically 2 (two) times daily.     VENTOLIN  HFA 108 (90 Base) MCG/ACT inhaler INHALE 1-2 PUFFS INTO THE LUNGS EVERY 4 HOURS AS NEEDED FOR WHEEZING OR SHORTNESS OF BREATH. 18 each 2   No current facility-administered medications for this visit.    SURGICAL HISTORY:  Past Surgical History:  Procedure Laterality Date   BIOPSY  06/18/2019   Procedure: BIOPSY;  Surgeon: Wilhelmenia Aloha Raddle., MD;  Location: [facility] ENDOSCOPY;  Service: Gastroenterology;;   COLONOSCOPY WITH PROPOFOL  N/A 06/18/2019   Procedure: COLONOSCOPY WITH PROPOFOL ;  Surgeon: Wilhelmenia Aloha Raddle., MD;  Location: [facility] ENDOSCOPY;  Service: Gastroenterology;  Laterality: N/A;   CYST EXCISION Right 12/18/2019   Procedure: EXCISION CYSTIC MASS WITH DISTAL INTERPHANLEGEAL JOINT ARTHROTOMY RIGHT INDEX FINGER;  Surgeon: Murrell Kuba, MD;  Location: [facility] OR;  Service: Orthopedics;  Laterality: Right;  IV REGIONAL FOREARM BLOCK   ESOPHAGOGASTRODUODENOSCOPY (EGD) WITH PROPOFOL  N/A 06/18/2019   Procedure: ESOPHAGOGASTRODUODENOSCOPY (EGD) WITH PROPOFOL ;  Surgeon: Wilhelmenia Aloha Raddle., MD;  Location: [facility] ENDOSCOPY;  Service: Gastroenterology;  Laterality: N/A;   GANGLION CYST EXCISION Left    L hand    LUMBAR LAMINECTOMY/DECOMPRESSION MICRODISCECTOMY Left 10/02/2012   Procedure: LUMBAR LAMINECTOMY/DECOMPRESSION MICRODISCECTOMY 2 LEVELS;  Surgeon: Reyes JONETTA Budge, MD;  Location: [facility] NEURO ORS;  Service:  Neurosurgery;  Laterality: Left;  Left Lumbar four-five Lumbar five sacral one diskectomy   PILONIDAL CYST EXCISION     POLYPECTOMY  06/18/2019   Procedure: POLYPECTOMY;  Surgeon: Mansouraty, Aloha Raddle., MD;  Location: [facility] ENDOSCOPY;  Service: Gastroenterology;;   SHOULDER ARTHROSCOPY  2008   Left   TONSILLECTOMY      REVIEW OF SYSTEMS:   Review of Systems  Constitutional: Positive for fatigue. Negative for appetite change, chills, fever and unexpected weight change.  HENT: Negative for mouth sores, nosebleeds, sore throat and trouble swallowing.   Eyes: Negative for eye problems and icterus.  Respiratory: Positive for shortness of breath. Negative for cough, hemoptysis, and wheezing.   Cardiovascular: Negative for chest pain and leg swelling.  Gastrointestinal: Negative for abdominal pain, constipation, diarrhea, nausea and vomiting.  Genitourinary: Negative for bladder incontinence, difficulty urinating, dysuria, frequency and hematuria.   Musculoskeletal: Negative for back pain, gait problem, neck pain and neck stiffness.  Skin: Negative for itching and rash.  Neurological: Negative for dizziness, extremity weakness, gait problem, headaches, light-headedness and seizures.  Hematological: Negative for adenopathy. Does not bruise/bleed easily.  Psychiatric/Behavioral: Negative for confusion, depression and sleep disturbance. The patient is not nervous/anxious.     PHYSICAL EXAMINATION:  Blood pressure 110/79, pulse 94, temperature 98.1 F (36.7 C), temperature source Temporal, resp. rate 17, height 5' 10 (1.778 m), weight 132 lb (59.9 kg), SpO2 99%.  ECOG PERFORMANCE STATUS: 2  Physical Exam  Constitutional: Oriented to person, place, and time and thin appearing male and in no distress.  HENT:  Head: Normocephalic and atraumatic.  Mouth/Throat: Oropharynx is clear and moist. No oropharyngeal exudate.  Eyes: Conjunctivae are normal. Right eye exhibits no discharge. Left eye  exhibits no discharge. No scleral icterus.  Neck: Normal range of motion. Neck supple.  Cardiovascular: Normal rate, regular rhythm, normal heart sounds and intact distal pulses.   Pulmonary/Chest: Effort normal. Quiet breath sounds bilaterally. On supplemental oxygen . No respiratory distress. No wheezes. No rales.  Abdominal: Soft. Bowel sounds are normal. Exhibits no distension and no mass. There is no tenderness.  Musculoskeletal: Normal range of motion. Exhibits no edema.  Lymphadenopathy:    No cervical adenopathy.  Neurological: Alert and oriented to person, place, and time. Exhibits muscle wasting. Examined in the wheelchair.  Skin: Skin is warm and dry. No rash noted. Not diaphoretic. No erythema. No pallor.  Psychiatric: Mood, memory and judgment normal.  Vitals reviewed.  LABORATORY DATA: Lab Results  Component Value Date   WBC 8.0 03/03/2024   HGB 13.9 03/03/2024   HCT 41.8 03/03/2024   MCV 93.3 03/03/2024   PLT 304 03/03/2024      Chemistry      Component Value Date/Time   NA 138 03/03/2024 1305   K 4.4 03/03/2024 1305   CL 101 03/03/2024 1305   CO2 32 03/03/2024 1305   BUN 19 03/03/2024 1305   CREATININE 1.63 (H) 03/03/2024 1305      Component Value Date/Time   CALCIUM 9.2 03/03/2024 1305   ALKPHOS 66 03/03/2024 1305   AST 18 03/03/2024 1305   ALT 12 03/03/2024 1305   BILITOT 0.6 03/03/2024 1305       RADIOGRAPHIC STUDIES:  No results found.   ASSESSMENT/PLAN:  Tyler Horne is a 83 y.o. male who presents for f/u of a normocytic anemia and iron  deficiency.     On his initial visit he was recommended to start on iron  325 mg p.o. daily with a source of vitamin C. given the relative stability of his iron  levels and hemoglobin Dr. Federico initially was concerned that the patient was not necessarily taking his iron  as prescribed (his clinic visits are often tangential and difficult to focus, unclear if some component of cognitive decline).    Fortunately the  patient has undergone GI evaluation with endoscopy and colonoscopy.  Colonoscopy was significant for internal hemorrhoids, but no overt source of bleeding was discovered.  Upper endoscopy revealed no clear sources of bleeding as well.  It was noted that if the patient continues to be iron  deficient capsule endoscopy could be considered.   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  --work up for alternative etiologies of his anemia were negative, included B12, folate,  and TSH. May have some component of low Hgb 2/2 to renal dysfunction.  --patient has undergone evaluation with EGD and colonoscopy in Jan 2021 which showed internal hemorrhoids and 3 mm polyp, but no other overt sources of bleeding.  --completed iron  sucrose x 5 doses in June 2021 --Labs today show white blood cell 8.0, hemoglobin 13.9, MCV 93.3, and platelets of 304 -- His iron  studies are WNL. His ferritin is pending. I will contact him with the results and recommendations. I let him know I do not think he will need IV iron  --He was instructed to continue iron  pills 325mg  PO daily with a source of vitamin C.  -- no clear indication for a bone marrow biopsy at this time.  --RTC in 12 months to continue monitoring  No orders of the defined types were placed in this encounter.    The total time spent in the appointment was 20-29 minutes  Jumaane Weatherford L Kendal Ghazarian, PA-C 03/03/24

## 2024-02-27 NOTE — Patient Instructions (Signed)
 Please continue your DuoNeb (albuterol /ipratropium) 4 times a day on a schedule Keep albuterol  available to use 2 puffs or 1 nebulizer treatment up to every 4 hours if needed for shortness of breath, chest tightness, wheezing. Flu shot today Continue your oxygen  at all times as you have been using it Refrain from smoking We talked today about your surgical risk for possible repair of an umbilical hernia.  Your underlying lung disease, age, oxygen  needs puts you at high risk for surgery or general anesthesia.  You would need to consider with your surgeon whether the benefits of surgery outweighed these high risks. Follow with Dr. Shelah in 12 months or sooner if you have any problems.

## 2024-02-27 NOTE — Assessment & Plan Note (Signed)
Continue your oxygen at all times as you have been using it. 

## 2024-02-27 NOTE — Progress Notes (Signed)
 Subjective:    Patient ID: Tyler Horne, male    DOB: 17-Apr-1941, 83 y.o.   MRN: [id number]  HPI  ROV 03/28/2023 --Tyler Horne is 4 with a history of tobacco use and very severe COPD.  He has a history of chronic hypoxemic respiratory failure on oxygen  at 2 L/min pulsed He has been managed on scheduled DuoNeb 4 times a day.  He quit smoking in 09/2020.  He reports that his breathing has been difficult, he has waked at night w SOB and cough. Able to walk through the house but then very SOB. Uses mucinex as needed. He has not had any pred or abx since last time.   ROV 02/27/2024 --Tyler Horne is 27 and follows up today for his very severe COPD, history of tobacco use.  He has associated chronic hypoxemic respiratory failure and uses oxygen  at 2 L/min. Currently managed on DuoNeb scheduled.  He has albuterol  that he uses approximately 1-2x a day.  He reports that he is using DuoNeb about 4-5x a day. Has run out before, but not recently.  He is coughing up scant sputum, white and thick, sometimes tan He says he hasn't smoked since Last visit.  He is willing the flu shot.  No pred or abx since last time. No hospitalizations.  He has an umbilical hernia, is considering a repair.    Review of Systems  Constitutional: Negative.  Negative for fever and unexpected weight change.  HENT:  Positive for postnasal drip and rhinorrhea. Negative for congestion, dental problem, ear pain, nosebleeds, sinus pressure, sneezing, sore throat and trouble swallowing.   Eyes: Negative.  Negative for redness and itching.  Respiratory:  Positive for cough and shortness of breath. Negative for chest tightness and wheezing.   Cardiovascular: Negative.  Negative for palpitations and leg swelling.  Gastrointestinal: Negative.  Negative for nausea and vomiting.  Genitourinary: Negative.  Negative for dysuria.  Musculoskeletal: Negative.  Negative for joint swelling.  Skin: Negative.  Negative for rash.  Neurological: Negative.   Negative for headaches.  Hematological: Negative.  Does not bruise/bleed easily.  Psychiatric/Behavioral: Negative.  Negative for dysphoric mood. The patient is not nervous/anxious.        Objective:   Physical Exam Vitals:   02/27/24 1153  BP: 124/73  Pulse: (!) 101  Temp: 98.7 F (37.1 C)  TempSrc: Temporal  SpO2: 97%  Height: 5' 10 (1.778 m)     Gen: Pleasant, thin with some temporal wasting, in no distress,  normal affect  ENT: No lesions,  mouth clear,  oropharynx clear, no postnasal drip  Neck: No JVD, no stridor  Lungs: No use of accessory muscles, distant and decreased throughout, few exp wheezes.   Cardiovascular: RRR, heart sounds normal, no murmur or gallops, no peripheral edema  Musculoskeletal: No deformities, no cyanosis or clubbing  Neuro: alert, non focal  Skin: Warm, no lesions or rashes     Assessment & Plan:  COPD (chronic obstructive pulmonary disease) (HCC) Please continue your DuoNeb (albuterol /ipratropium) 4 times a day on a schedule Keep albuterol  available to use 2 puffs or 1 nebulizer treatment up to every 4 hours if needed for shortness of breath, chest tightness, wheezing. Flu shot today Refrain from smoking We talked today about your surgical risk for possible repair of an umbilical hernia.  Your underlying lung disease, age, oxygen  needs puts you at high risk for surgery or general anesthesia.  You would need to consider with your surgeon whether the benefits of surgery  outweighed these high risks. Follow with Dr. Shelah in 12 months or sooner if you have any problems.   Chronic respiratory failure with hypoxia (HCC) Continue your oxygen  at all times as you have been using it  Tobacco use disorder He states that he has quit but unclear whether this is completely accurate.  He may be still intermittently smoking a few cigarettes a week.     Lamar Shelah, MD, PhD 02/27/2024, 12:51 PM [facility] [phone] or  if no answer [phone]

## 2024-02-27 NOTE — Assessment & Plan Note (Signed)
 Please continue your DuoNeb (albuterol /ipratropium) 4 times a day on a schedule Keep albuterol  available to use 2 puffs or 1 nebulizer treatment up to every 4 hours if needed for shortness of breath, chest tightness, wheezing. Flu shot today Refrain from smoking We talked today about your surgical risk for possible repair of an umbilical hernia.  Your underlying lung disease, age, oxygen  needs puts you at high risk for surgery or general anesthesia.  You would need to consider with your surgeon whether the benefits of surgery outweighed these high risks. Follow with Dr. Shelah in 12 months or sooner if you have any problems.

## 2024-03-03 ENCOUNTER — Inpatient Hospital Stay: Attending: Hematology and Oncology

## 2024-03-03 ENCOUNTER — Inpatient Hospital Stay (HOSPITAL_BASED_OUTPATIENT_CLINIC_OR_DEPARTMENT_OTHER): Admitting: Physician Assistant

## 2024-03-03 ENCOUNTER — Encounter: Payer: Self-pay | Admitting: Hematology and Oncology

## 2024-03-03 VITALS — BP 110/79 | HR 94 | Temp 98.1°F | Resp 17 | Ht 70.0 in | Wt 132.0 lb

## 2024-03-03 DIAGNOSIS — D500 Iron deficiency anemia secondary to blood loss (chronic): Secondary | ICD-10-CM | POA: Diagnosis not present

## 2024-03-03 DIAGNOSIS — R0602 Shortness of breath: Secondary | ICD-10-CM | POA: Diagnosis not present

## 2024-03-03 DIAGNOSIS — D509 Iron deficiency anemia, unspecified: Secondary | ICD-10-CM | POA: Diagnosis present

## 2024-03-03 DIAGNOSIS — Z9981 Dependence on supplemental oxygen: Secondary | ICD-10-CM | POA: Insufficient documentation

## 2024-03-03 LAB — CBC WITH DIFFERENTIAL (CANCER CENTER ONLY)
Abs Immature Granulocytes: 0.03 K/uL (ref 0.00–0.07)
Basophils Absolute: 0.1 K/uL (ref 0.0–0.1)
Basophils Relative: 1 %
Eosinophils Absolute: 0.3 K/uL (ref 0.0–0.5)
Eosinophils Relative: 4 %
HCT: 41.8 % (ref 39.0–52.0)
Hemoglobin: 13.9 g/dL (ref 13.0–17.0)
Immature Granulocytes: 0 %
Lymphocytes Relative: 16 %
Lymphs Abs: 1.3 K/uL (ref 0.7–4.0)
MCH: 31 pg (ref 26.0–34.0)
MCHC: 33.3 g/dL (ref 30.0–36.0)
MCV: 93.3 fL (ref 80.0–100.0)
Monocytes Absolute: 0.5 K/uL (ref 0.1–1.0)
Monocytes Relative: 6 %
Neutro Abs: 5.8 K/uL (ref 1.7–7.7)
Neutrophils Relative %: 73 %
Platelet Count: 304 K/uL (ref 150–400)
RBC: 4.48 MIL/uL (ref 4.22–5.81)
RDW: 12.9 % (ref 11.5–15.5)
WBC Count: 8 K/uL (ref 4.0–10.5)
nRBC: 0 % (ref 0.0–0.2)

## 2024-03-03 LAB — IRON AND IRON BINDING CAPACITY (CC-WL,HP ONLY)
Iron: 112 ug/dL (ref 45–182)
Saturation Ratios: 34 % (ref 17.9–39.5)
TIBC: 332 ug/dL (ref 250–450)
UIBC: 220 ug/dL (ref 117–376)

## 2024-03-03 LAB — CMP (CANCER CENTER ONLY)
ALT: 12 U/L (ref 0–44)
AST: 18 U/L (ref 15–41)
Albumin: 4.3 g/dL (ref 3.5–5.0)
Alkaline Phosphatase: 66 U/L (ref 38–126)
Anion gap: 5 (ref 5–15)
BUN: 19 mg/dL (ref 8–23)
CO2: 32 mmol/L (ref 22–32)
Calcium: 9.2 mg/dL (ref 8.9–10.3)
Chloride: 101 mmol/L (ref 98–111)
Creatinine: 1.63 mg/dL — ABNORMAL HIGH (ref 0.61–1.24)
GFR, Estimated: 42 mL/min — ABNORMAL LOW (ref >60)
Glucose, Bld: 106 mg/dL — ABNORMAL HIGH (ref 70–99)
Potassium: 4.4 mmol/L (ref 3.5–5.1)
Sodium: 138 mmol/L (ref 135–145)
Total Bilirubin: 0.6 mg/dL (ref 0.0–1.2)
Total Protein: 6.7 g/dL (ref 6.5–8.1)

## 2024-03-03 LAB — RETIC PANEL
Immature Retic Fract: 9.8 % (ref 2.3–15.9)
RBC.: 4.5 MIL/uL (ref 4.22–5.81)
Retic Count, Absolute: 69.8 K/uL (ref 19.0–186.0)
Retic Ct Pct: 1.6 % (ref 0.4–3.1)
Reticulocyte Hemoglobin: 35.5 pg (ref >27.9)

## 2024-03-03 LAB — FERRITIN: Ferritin: 166 ng/mL (ref 24–336)

## 2024-03-05 ENCOUNTER — Ambulatory Visit: Payer: Self-pay | Admitting: *Deleted

## 2024-03-05 ENCOUNTER — Telehealth: Payer: Self-pay | Admitting: Hematology and Oncology

## 2024-03-05 NOTE — Telephone Encounter (Signed)
 TCT patient regarding recent lab results.No Answer but was able to leave vm message on his identified phone vm Advised that his Hgb and iron  are all within normal limits. We can plan to see him back in 6 months to recheck. Advised to call back to [phone] with any questions or concerns

## 2024-03-05 NOTE — Telephone Encounter (Signed)
 Left the patient a voicemail with the appointment details per LOS.

## 2024-03-05 NOTE — Telephone Encounter (Signed)
-----   Message from Norleen ONEIDA Kidney IV sent at 03/04/2024  9:22 AM EDT ----- Please let Tyler Horne know that his Hgb and iron  are all within normal limits. We can plan to see him back in 6 months to recheck.  ----- Message ----- From: Rebecka, Lab In [place] Sent: 03/03/2024   1:26 PM EDT To: Norleen ONEIDA Kidney [place], MD

## 2024-03-23 ENCOUNTER — Encounter: Payer: Self-pay | Admitting: Hematology and Oncology

## 2024-05-07 ENCOUNTER — Other Ambulatory Visit: Payer: Self-pay | Admitting: Emergency Medicine

## 2024-09-04 ENCOUNTER — Inpatient Hospital Stay

## 2024-09-11 ENCOUNTER — Inpatient Hospital Stay: Admitting: Hematology and Oncology

## 2025-03-05 ENCOUNTER — Other Ambulatory Visit

## 2025-03-05 ENCOUNTER — Ambulatory Visit: Admitting: Hematology and Oncology
